# Patient Record
Sex: Female | Born: 1985 | Race: Black or African American | Hispanic: No | Marital: Married | Smoking: Never smoker
Health system: Southern US, Community
[De-identification: ages and names within clinical notes are randomized; demographics above are authoritative.]

## PROBLEM LIST (undated history)

## (undated) DIAGNOSIS — F419 Anxiety disorder, unspecified: Secondary | ICD-10-CM

## (undated) DIAGNOSIS — C801 Malignant (primary) neoplasm, unspecified: Secondary | ICD-10-CM

## (undated) DIAGNOSIS — D649 Anemia, unspecified: Secondary | ICD-10-CM

## (undated) DIAGNOSIS — K635 Polyp of colon: Secondary | ICD-10-CM

## (undated) DIAGNOSIS — J302 Other seasonal allergic rhinitis: Secondary | ICD-10-CM

## (undated) DIAGNOSIS — T7840XA Allergy, unspecified, initial encounter: Secondary | ICD-10-CM

## (undated) DIAGNOSIS — R112 Nausea with vomiting, unspecified: Secondary | ICD-10-CM

## (undated) DIAGNOSIS — Z9889 Other specified postprocedural states: Secondary | ICD-10-CM

## (undated) HISTORY — PX: COSMETIC SURGERY: SHX468

## (undated) HISTORY — DX: Allergy, unspecified, initial encounter: T78.40XA

## (undated) HISTORY — PX: COLONOSCOPY: SHX174

## (undated) HISTORY — PX: MASTECTOMY: SHX3

## (undated) HISTORY — DX: Polyp of colon: K63.5

## (undated) HISTORY — PX: CYSTECTOMY: SUR359

## (undated) HISTORY — DX: Anxiety disorder, unspecified: F41.9

## (undated) HISTORY — PX: WISDOM TOOTH EXTRACTION: SHX21

## (undated) HISTORY — PX: OVARY SURGERY: SHX727

## (undated) HISTORY — PX: MYOMECTOMY: SHX85

## (undated) HISTORY — PX: REDUCTION MAMMAPLASTY: SUR839

---
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
## 2004-06-18 ENCOUNTER — Emergency Department (HOSPITAL_COMMUNITY): Admission: EM | Admit: 2004-06-18 | Discharge: 2004-06-18 | Payer: Self-pay | Admitting: Emergency Medicine

## 2005-12-09 ENCOUNTER — Inpatient Hospital Stay (HOSPITAL_COMMUNITY): Admission: AD | Admit: 2005-12-09 | Discharge: 2005-12-09 | Payer: Self-pay | Admitting: Gynecology

## 2006-09-11 ENCOUNTER — Inpatient Hospital Stay (HOSPITAL_COMMUNITY): Admission: AD | Admit: 2006-09-11 | Discharge: 2006-09-12 | Payer: Self-pay | Admitting: Obstetrics and Gynecology

## 2006-10-13 ENCOUNTER — Ambulatory Visit (HOSPITAL_COMMUNITY): Admission: RE | Admit: 2006-10-13 | Discharge: 2006-10-13 | Payer: Self-pay | Admitting: Obstetrics and Gynecology

## 2007-02-23 ENCOUNTER — Emergency Department (HOSPITAL_COMMUNITY): Admission: EM | Admit: 2007-02-23 | Discharge: 2007-02-23 | Payer: Self-pay | Admitting: Emergency Medicine

## 2008-02-19 ENCOUNTER — Inpatient Hospital Stay (HOSPITAL_COMMUNITY): Admission: AD | Admit: 2008-02-19 | Discharge: 2008-02-19 | Payer: Self-pay | Admitting: Obstetrics & Gynecology

## 2008-02-23 ENCOUNTER — Ambulatory Visit (HOSPITAL_COMMUNITY): Admission: RE | Admit: 2008-02-23 | Discharge: 2008-02-23 | Payer: Self-pay | Admitting: Family Medicine

## 2008-04-03 ENCOUNTER — Encounter: Payer: Self-pay | Admitting: Obstetrics & Gynecology

## 2008-04-03 ENCOUNTER — Inpatient Hospital Stay (HOSPITAL_COMMUNITY): Admission: RE | Admit: 2008-04-03 | Discharge: 2008-04-05 | Payer: Self-pay | Admitting: Obstetrics & Gynecology

## 2008-08-29 ENCOUNTER — Inpatient Hospital Stay (HOSPITAL_COMMUNITY): Admission: AD | Admit: 2008-08-29 | Discharge: 2008-08-29 | Payer: Self-pay | Admitting: Obstetrics

## 2008-09-03 ENCOUNTER — Emergency Department (HOSPITAL_BASED_OUTPATIENT_CLINIC_OR_DEPARTMENT_OTHER): Admission: EM | Admit: 2008-09-03 | Discharge: 2008-09-03 | Payer: Self-pay | Admitting: Emergency Medicine

## 2008-09-03 ENCOUNTER — Ambulatory Visit: Payer: Self-pay | Admitting: Radiology

## 2008-09-11 ENCOUNTER — Ambulatory Visit: Payer: Self-pay | Admitting: Family Medicine

## 2009-01-09 ENCOUNTER — Ambulatory Visit: Payer: Self-pay | Admitting: Radiology

## 2009-01-09 ENCOUNTER — Emergency Department (HOSPITAL_BASED_OUTPATIENT_CLINIC_OR_DEPARTMENT_OTHER): Admission: EM | Admit: 2009-01-09 | Discharge: 2009-01-09 | Payer: Self-pay | Admitting: Emergency Medicine

## 2009-07-28 IMAGING — US US TRANSVAGINAL NON-OB
1 series · 13 of 25 positions shown · non-contrast
Comparison: 02/23/2007

CLINICAL DATA: Known fibroids.  Evaluate for ovarian cyst.  LMP
02/05/2008.  Status post right salpingo-oophorectomy.

TRANSABDOMINAL AND TRANSVAGINAL ULTRASOUND OF PELVIS
TECHNIQUE: Both transabdominal and transvaginal ultrasound
examinations of the pelvis were performed including evaluation of
the uterus, ovaries, adnexal regions, and pelvic cul-de-sac.

[Series 1: us transvaginal non-ob · 0.28mm/px · 13 of 39 slices shown]
[im 1/39]
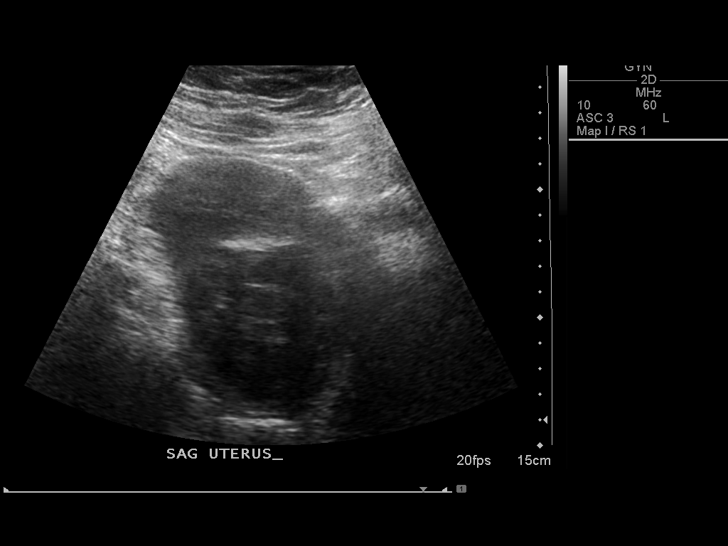
[im 4/39]
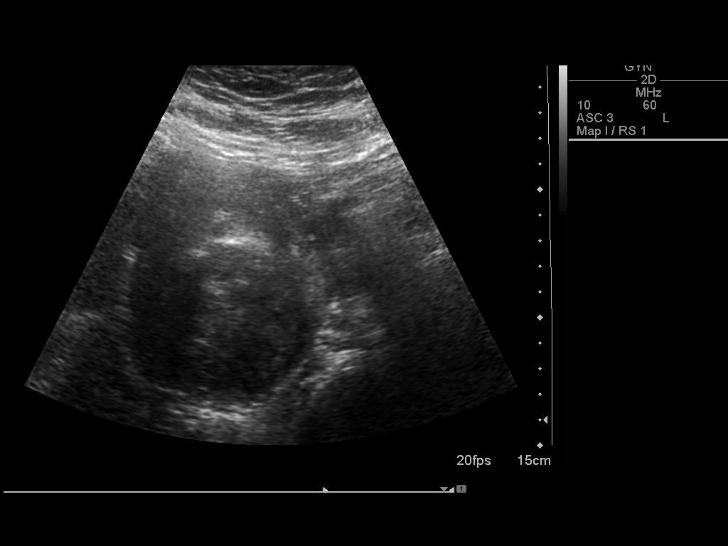
[im 7/39]
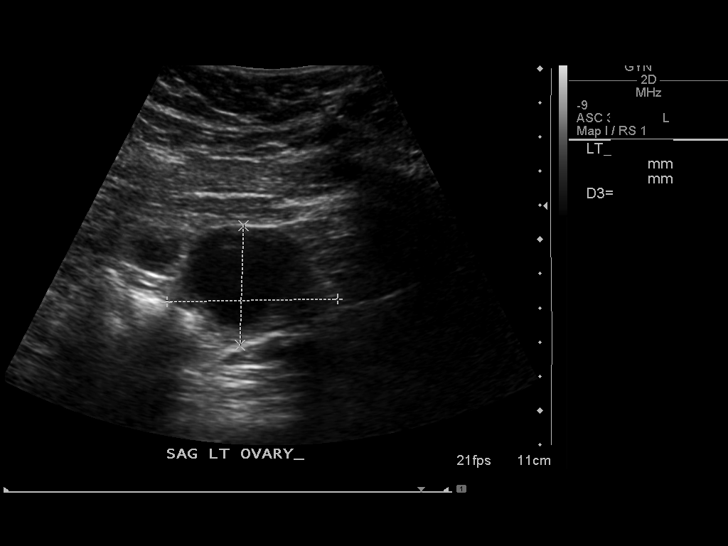
[im 10/39]
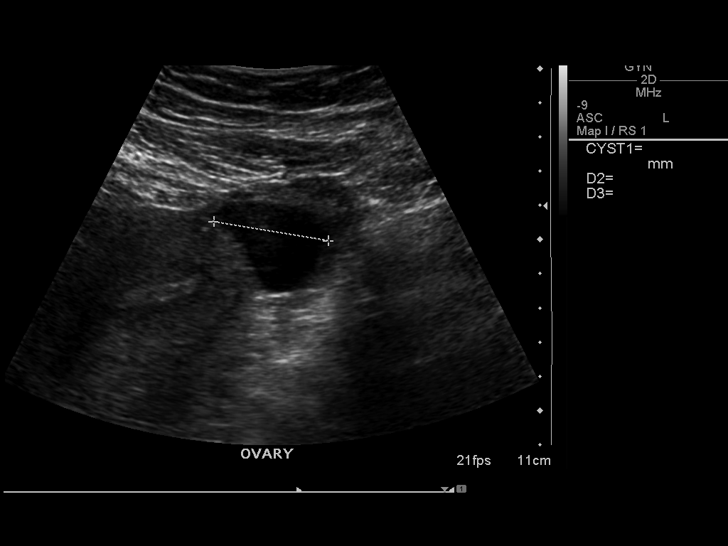
[im 13/39]
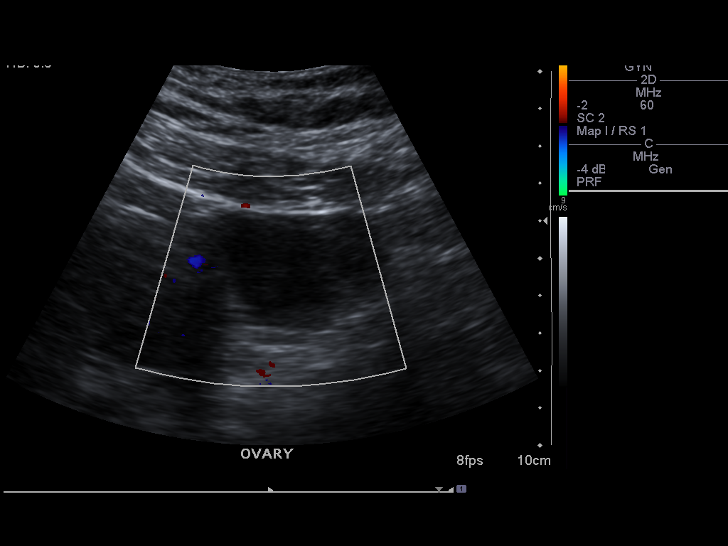
[im 16/39]
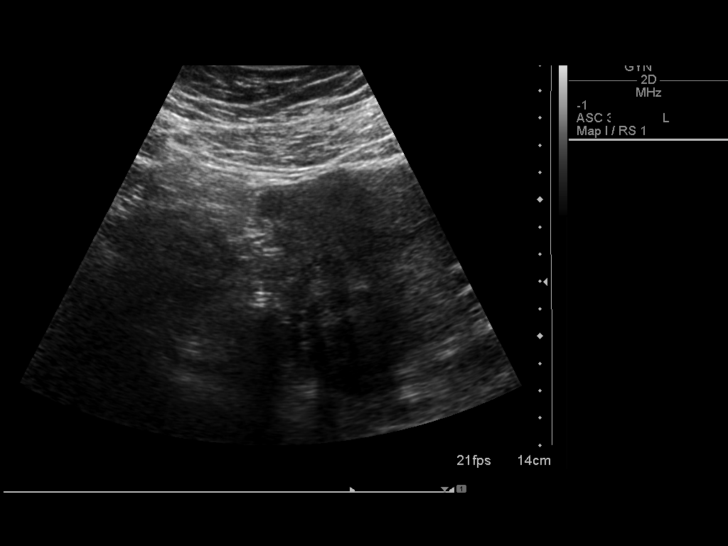
[im 20/39]
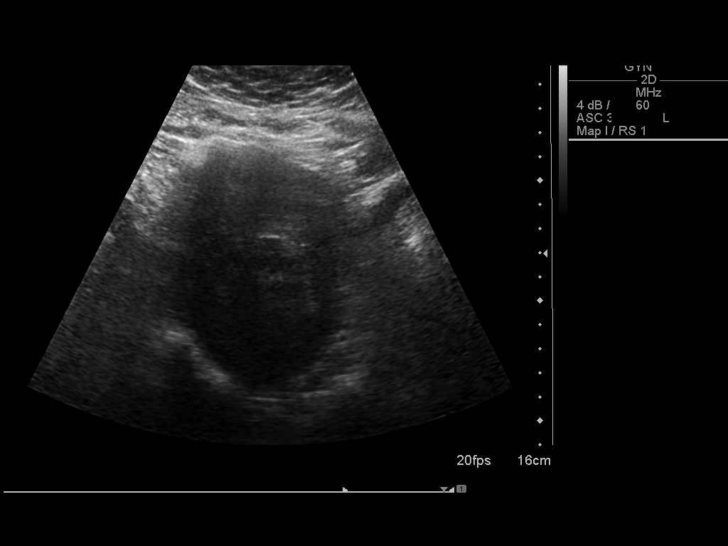
[im 23/39]
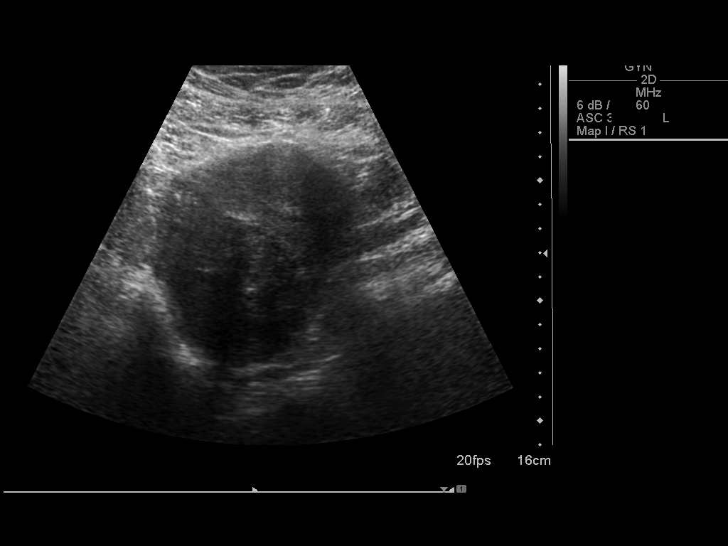
[im 26/39]
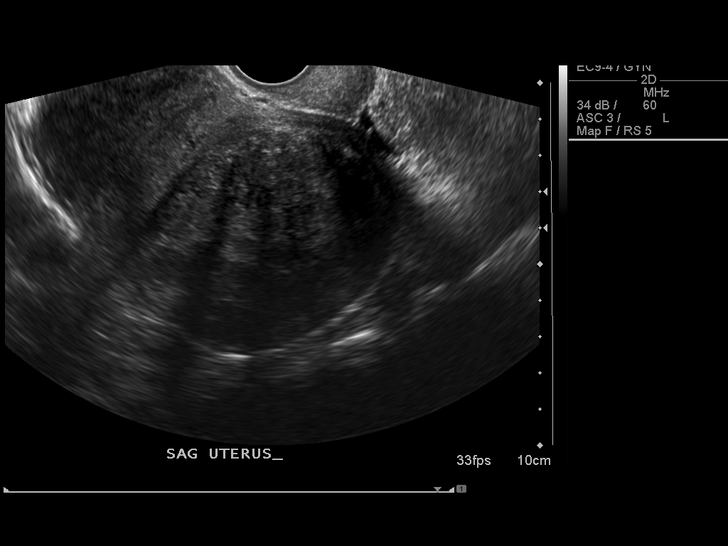
[im 29/39]
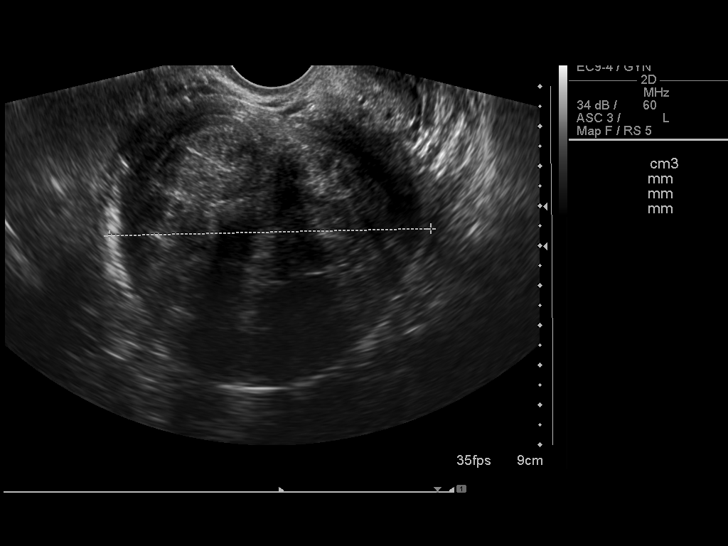
[im 32/39]
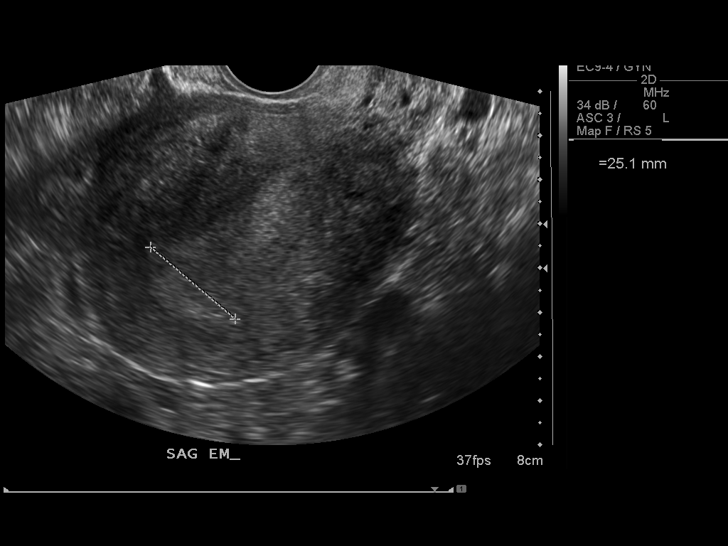
[im 35/39]
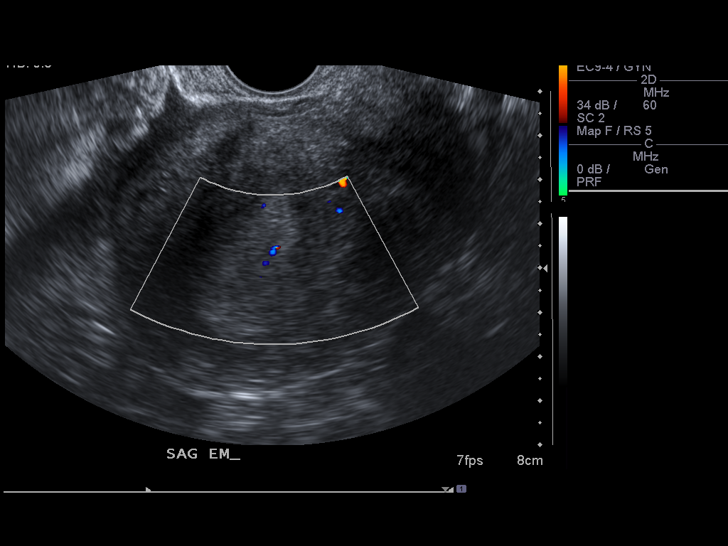
[im 39/39]
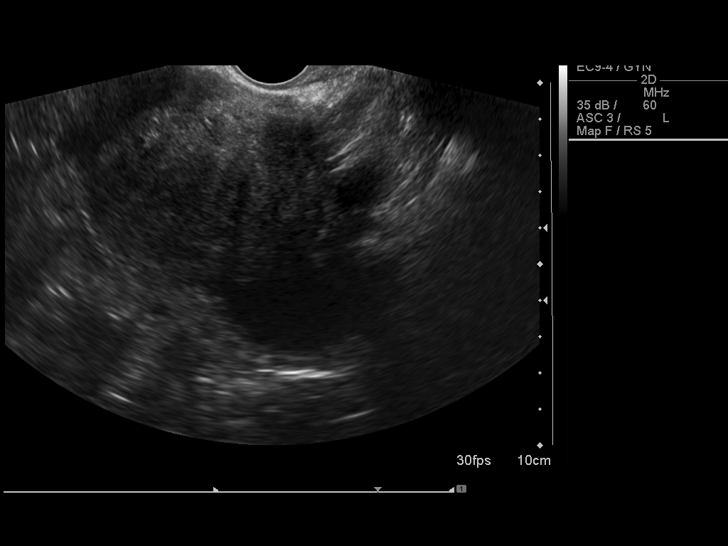

[13 of 25 positions shown; findings below may reference images not displayed]

FINDINGS: The uterus is enlarged with a sagittal length of 12.8 cm,
an AP width of 9.4 cm and a transverse width of 7.7 cm.  Two focal
fibroids are identified.  The largest is located in the posterior
lower uterine segment with a significant subserosal component
measuring 5.9 x 7.9 x 8.1 cm.  A second smaller fibroid is
identified in the anterior upper uterine segment with a small
subserosal component measuring 2.4 x 2.5 x 2.6 cm.

Endovaginally the endometrial canal appears thickened with an AP
width of 2.5 cm.  This is homogeneous in nature.  This would be
considered prominent even for the patient's given presecretory
phase of the cycle.  No abnormal flow is seen within the canal and
further evaluation in the immediate post secretary phase of the
cycle would be useful to exclude a focal endometrial abnormality.
If the lining appears prominent in the post secretary phase of the
cycle, sonohysterography would be useful for complete evaluation.

The left ovary is best seen transabdominally measuring 5.0 x 3.5 x
4.8 cm and containing a unilocular cyst which measures 3.4 x 2.9 x
2.6 cm.  Transabdominally this appears simple in nature, but this
could not be confirmed endovaginally as the cyst was inadequately
assessed by this method.  This is likely physiologic but can be
reassessed at the time of endometrial reassessment.
IMPRESSION: Fibroid uterus with fibroid sizes and locations as noted above.
Thick endometrial lining.  Please see above report for discussion
and followup recommendations.  Unilocular left ovarian cyst felt
likely to be simple by transabdominal imaging.  Short-term follow-
up at the time of endometrial reassessment would be recommended to
assess for resolution or involution as would be anticipated with a
physiologic finding.

## 2009-11-21 ENCOUNTER — Ambulatory Visit: Payer: Self-pay | Admitting: Family Medicine

## 2009-11-21 ENCOUNTER — Encounter: Admission: RE | Admit: 2009-11-21 | Discharge: 2009-11-21 | Payer: Self-pay | Admitting: Family Medicine

## 2010-02-20 ENCOUNTER — Emergency Department (HOSPITAL_BASED_OUTPATIENT_CLINIC_OR_DEPARTMENT_OTHER): Admission: EM | Admit: 2010-02-20 | Discharge: 2010-02-20 | Payer: Self-pay | Admitting: Emergency Medicine

## 2010-03-17 ENCOUNTER — Emergency Department (HOSPITAL_BASED_OUTPATIENT_CLINIC_OR_DEPARTMENT_OTHER): Admission: EM | Admit: 2010-03-17 | Discharge: 2010-03-17 | Payer: Self-pay | Admitting: Emergency Medicine

## 2010-03-20 ENCOUNTER — Emergency Department (HOSPITAL_BASED_OUTPATIENT_CLINIC_OR_DEPARTMENT_OTHER): Admission: EM | Admit: 2010-03-20 | Discharge: 2010-03-21 | Payer: Self-pay | Admitting: Emergency Medicine

## 2010-03-24 ENCOUNTER — Ambulatory Visit: Payer: Self-pay | Admitting: Family Medicine

## 2010-04-07 ENCOUNTER — Ambulatory Visit: Payer: Self-pay | Admitting: Diagnostic Radiology

## 2010-04-07 ENCOUNTER — Emergency Department (HOSPITAL_BASED_OUTPATIENT_CLINIC_OR_DEPARTMENT_OTHER): Admission: EM | Admit: 2010-04-07 | Discharge: 2010-04-07 | Payer: Self-pay | Admitting: Emergency Medicine

## 2010-04-08 ENCOUNTER — Ambulatory Visit: Payer: Self-pay | Admitting: Physician Assistant

## 2010-04-09 ENCOUNTER — Encounter: Admission: RE | Admit: 2010-04-09 | Discharge: 2010-04-09 | Payer: Self-pay | Admitting: Family Medicine

## 2010-04-30 ENCOUNTER — Emergency Department (HOSPITAL_BASED_OUTPATIENT_CLINIC_OR_DEPARTMENT_OTHER): Admission: EM | Admit: 2010-04-30 | Discharge: 2010-04-30 | Payer: Self-pay | Admitting: Emergency Medicine

## 2010-05-03 ENCOUNTER — Emergency Department (HOSPITAL_BASED_OUTPATIENT_CLINIC_OR_DEPARTMENT_OTHER): Admission: EM | Admit: 2010-05-03 | Discharge: 2010-05-03 | Payer: Self-pay | Admitting: Emergency Medicine

## 2010-05-08 ENCOUNTER — Emergency Department (HOSPITAL_BASED_OUTPATIENT_CLINIC_OR_DEPARTMENT_OTHER): Admission: EM | Admit: 2010-05-08 | Discharge: 2010-05-08 | Payer: Self-pay | Admitting: Emergency Medicine

## 2010-05-20 ENCOUNTER — Emergency Department (HOSPITAL_BASED_OUTPATIENT_CLINIC_OR_DEPARTMENT_OTHER): Admission: EM | Admit: 2010-05-20 | Discharge: 2010-05-20 | Payer: Self-pay | Admitting: Emergency Medicine

## 2010-06-26 ENCOUNTER — Encounter: Admission: RE | Admit: 2010-06-26 | Discharge: 2010-06-26 | Payer: Self-pay | Admitting: Obstetrics

## 2010-09-04 ENCOUNTER — Emergency Department (HOSPITAL_BASED_OUTPATIENT_CLINIC_OR_DEPARTMENT_OTHER)
Admission: EM | Admit: 2010-09-04 | Discharge: 2010-09-04 | Payer: Self-pay | Source: Home / Self Care | Admitting: Emergency Medicine

## 2010-09-04 LAB — DIFFERENTIAL
Basophils Absolute: 0 10*3/uL (ref 0.0–0.1)
Basophils Relative: 0 % (ref 0–1)
Eosinophils Absolute: 0.2 10*3/uL (ref 0.0–0.7)
Eosinophils Relative: 4 % (ref 0–5)
Lymphocytes Relative: 43 % (ref 12–46)
Lymphs Abs: 2.6 10*3/uL (ref 0.7–4.0)
Monocytes Absolute: 0.7 10*3/uL (ref 0.1–1.0)
Monocytes Relative: 12 % (ref 3–12)
Neutro Abs: 2.4 10*3/uL (ref 1.7–7.7)
Neutrophils Relative %: 40 % — ABNORMAL LOW (ref 43–77)

## 2010-09-04 LAB — COMPREHENSIVE METABOLIC PANEL
ALT: 16 U/L (ref 0–35)
AST: 22 U/L (ref 0–37)
Albumin: 4.3 g/dL (ref 3.5–5.2)
Alkaline Phosphatase: 78 U/L (ref 39–117)
BUN: 5 mg/dL — ABNORMAL LOW (ref 6–23)
CO2: 24 mEq/L (ref 19–32)
Calcium: 9.2 mg/dL (ref 8.4–10.5)
Chloride: 106 mEq/L (ref 96–112)
Creatinine, Ser: 0.6 mg/dL (ref 0.4–1.2)
GFR calc Af Amer: >60 mL/min (ref >60)
GFR calc non Af Amer: >60 mL/min (ref >60)
Glucose, Bld: 86 mg/dL (ref 70–99)
Potassium: 4 mEq/L (ref 3.5–5.1)
Sodium: 142 mEq/L (ref 135–145)
Total Bilirubin: 0.6 mg/dL (ref 0.3–1.2)
Total Protein: 8.4 g/dL — ABNORMAL HIGH (ref 6.0–8.3)

## 2010-09-04 LAB — LIPASE, BLOOD: Lipase: 122 U/L (ref 23–300)

## 2010-09-04 LAB — CBC
HCT: 32.2 % — ABNORMAL LOW (ref 36.0–46.0)
Hemoglobin: 10.5 g/dL — ABNORMAL LOW (ref 12.0–15.0)
MCH: 19.8 pg — ABNORMAL LOW (ref 26.0–34.0)
MCHC: 32.6 g/dL (ref 30.0–36.0)
MCV: 60.6 fL — ABNORMAL LOW (ref 78.0–100.0)
Platelets: 365 10*3/uL (ref 150–400)
RBC: 5.31 MIL/uL — ABNORMAL HIGH (ref 3.87–5.11)
RDW: 19 % — ABNORMAL HIGH (ref 11.5–15.5)
WBC: 6 10*3/uL (ref 4.0–10.5)

## 2010-09-04 LAB — URINALYSIS, ROUTINE W REFLEX MICROSCOPIC
Bilirubin Urine: NEGATIVE
Ketones, ur: NEGATIVE mg/dL
Nitrite: NEGATIVE
Protein, ur: 30 mg/dL — AB
Specific Gravity, Urine: 1.021 (ref 1.005–1.030)
Urine Glucose, Fasting: NEGATIVE mg/dL
Urobilinogen, UA: 0.2 mg/dL (ref 0.0–1.0)
pH: 5.5 (ref 5.0–8.0)

## 2010-09-04 LAB — URINE MICROSCOPIC-ADD ON

## 2010-09-04 LAB — PREGNANCY, URINE: Preg Test, Ur: NEGATIVE

## 2010-09-09 ENCOUNTER — Ambulatory Visit: Admit: 2010-09-09 | Payer: Self-pay | Admitting: Family Medicine

## 2010-09-20 ENCOUNTER — Encounter: Payer: Self-pay | Admitting: Obstetrics

## 2010-11-13 LAB — BASIC METABOLIC PANEL
BUN: 7 mg/dL (ref 6–23)
Chloride: 108 mEq/L (ref 96–112)
GFR calc non Af Amer: >60 mL/min (ref >60)
Glucose, Bld: 116 mg/dL — ABNORMAL HIGH (ref 70–99)
Potassium: 4 mEq/L (ref 3.5–5.1)
Sodium: 143 mEq/L (ref 135–145)

## 2010-11-13 LAB — URINALYSIS, ROUTINE W REFLEX MICROSCOPIC
Bilirubin Urine: NEGATIVE
Glucose, UA: NEGATIVE mg/dL
Ketones, ur: NEGATIVE mg/dL
Nitrite: NEGATIVE
Specific Gravity, Urine: 1.016 (ref 1.005–1.030)
pH: 6 (ref 5.0–8.0)

## 2010-11-13 LAB — WET PREP, GENITAL
Trich, Wet Prep: NONE SEEN
Yeast Wet Prep HPF POC: NONE SEEN

## 2010-11-13 LAB — DIFFERENTIAL
Basophils Relative: 0 % (ref 0–1)
Lymphocytes Relative: 28 % (ref 12–46)
Lymphs Abs: 2.1 10*3/uL (ref 0.7–4.0)
Monocytes Relative: 8 % (ref 3–12)
Neutro Abs: 4.7 10*3/uL (ref 1.7–7.7)

## 2010-11-13 LAB — CBC
HCT: 30.6 % — ABNORMAL LOW (ref 36.0–46.0)
Hemoglobin: 10.3 g/dL — ABNORMAL LOW (ref 12.0–15.0)
MCV: 69.4 fL — ABNORMAL LOW (ref 78.0–100.0)
WBC: 7.6 10*3/uL (ref 4.0–10.5)

## 2010-11-13 LAB — PREGNANCY, URINE: Preg Test, Ur: NEGATIVE

## 2010-11-14 LAB — DIFFERENTIAL
Basophils Relative: 0 % (ref 0–1)
Eosinophils Relative: 5 % (ref 0–5)
Monocytes Absolute: 0.8 10*3/uL (ref 0.1–1.0)
Monocytes Relative: 14 % — ABNORMAL HIGH (ref 3–12)
Neutro Abs: 2.5 10*3/uL (ref 1.7–7.7)

## 2010-11-14 LAB — URINALYSIS, ROUTINE W REFLEX MICROSCOPIC
Glucose, UA: NEGATIVE mg/dL
Protein, ur: NEGATIVE mg/dL

## 2010-11-14 LAB — COMPREHENSIVE METABOLIC PANEL
ALT: 11 U/L (ref 0–35)
AST: 16 U/L (ref 0–37)
Albumin: 3.8 g/dL (ref 3.5–5.2)
Alkaline Phosphatase: 57 U/L (ref 39–117)
BUN: 9 mg/dL (ref 6–23)
GFR calc Af Amer: >60 mL/min (ref >60)
Potassium: 4.2 mEq/L (ref 3.5–5.1)
Sodium: 143 mEq/L (ref 135–145)
Total Protein: 7.7 g/dL (ref 6.0–8.3)

## 2010-11-14 LAB — URINE MICROSCOPIC-ADD ON

## 2010-11-14 LAB — CBC
Platelets: 278 10*3/uL (ref 150–400)
RBC: 4.12 MIL/uL (ref 3.87–5.11)
RDW: 14.8 % (ref 11.5–15.5)
WBC: 5.4 10*3/uL (ref 4.0–10.5)

## 2010-11-16 LAB — CBC
MCH: 25.8 pg — ABNORMAL LOW (ref 26.0–34.0)
MCHC: 33.4 g/dL (ref 30.0–36.0)
Platelets: 292 10*3/uL (ref 150–400)

## 2010-11-16 LAB — DIFFERENTIAL
Basophils Relative: 0 % (ref 0–1)
Eosinophils Absolute: 0.3 10*3/uL (ref 0.0–0.7)
Neutrophils Relative %: 48 % (ref 43–77)

## 2010-12-15 LAB — DIFFERENTIAL
Eosinophils Absolute: 0.2 10*3/uL (ref 0.0–0.7)
Lymphocytes Relative: 31 % (ref 12–46)
Lymphs Abs: 2 10*3/uL (ref 0.7–4.0)
Neutrophils Relative %: 54 % (ref 43–77)

## 2010-12-15 LAB — PREGNANCY, URINE: Preg Test, Ur: NEGATIVE

## 2010-12-15 LAB — CBC
Platelets: 235 10*3/uL (ref 150–400)
WBC: 6.4 10*3/uL (ref 4.0–10.5)

## 2011-01-13 NOTE — Op Note (Signed)
Alejandra Figueroa, PEDDIE              ACCOUNT NO.:  [id number]   MEDICAL RECORD NO.:  [id number]          PATIENT TYPE:  INP   LOCATION:  9316                          FACILITY:  [facility]   PHYSICIAN:  Roseanna Rainbow, M.D.DATE OF BIRTH:  12/10/1985   DATE OF PROCEDURE:  04/03/2008  DATE OF DISCHARGE:                               OPERATIVE REPORT   PREOPERATIVE DIAGNOSIS:  Symptomatic uterine fibroids.   POSTOPERATIVE DIAGNOSIS:  Symptomatic uterine fibroids.   PROCEDURE:  Multiple abdominal myomectomy.   SURGEON:  Roseanna Rainbow, MD.   ASSISTANT:  Charles A. Clearance Coots, MD   ANESTHESIA:  General endotracheal.   FINDINGS:  There were filmy adhesions involving the anterior cul-de-sac.  The right tube and ovary were surgically absent.  There was a fairly  large posterior fundal myoma approximately 6 cm in diameter and there  was a smaller anterior right-sided subserosal myoma approximately 4 cm  in diameter.   PATHOLOGY:  Myomas.   ESTIMATED BLOOD LOSS:  125 mL.   COMPLICATIONS:  None.   PROCEDURE:  The patient was taken to the operating room with an IV  running.  She was placed in the dorsal lithotomy position and prepped  and draped in the usual sterile fashion.  After a time-out had been  completed, the previous Pfannenstiel incision scar was incised with the  scalpel.  This was carried down to the underlying fascia.  The fascia  was nicked through the midline.  The fascial incision was then extended  bilaterally.  The superior aspect of the fascial incision was tented up  and the underlying rectus muscles were dissected off.  The inferior  aspect of the fascial incision was manipulated in a similar fashion.  The rectus muscles were separated in the midline.  The parietal  peritoneum was then tented up and entered sharply.  This incision was  then extended superiorly and inferiorly with good visualization of the  bladder.  At this point, an Alexis retractor was placed  into the  incision and the bowel packed away with moist laparotomy sponges.  The  serosa overlying the posterior fibroid was then infiltrated with a  dilute Pitressin solution.  The serosa was then incised using the Bovie  down to the myoma.  Using the Bovie and traction and counter traction,  the myoma was enucleated.  The base was then secured with a Kelly clamp  and a suture ligature of 0 Monocryl.  The defect in the myometrium was  then repaired in layers using figure-of-eight sutures of 0 Monocryl.  The serosa was reapproximated in a running fashion using 2-0 Monocryl.  Adequate hemostasis was noted.  The anterior myoma was manipulated in a  similar fashion.  The pelvis was copiously irrigated.  The anterior cul-  de-sac filmy adhesions were divided using blunt and sharp dissection.  Seprafilm was then placed over the incisions on the uterus.  All the  packs and instruments were then removed from the abdomen.  At the close  of the procedure, the parietal peritoneum was then reapproximated in a  running fashion using 2-0  Vicryl.  The fascia was closed in a running fashion using 0 PDS.  The  skin was closed in a subcuticular fashion using 3-0 Monocryl.  At the  close of the procedure, the instrument and pack counts were said to be  correct x2.  The patient was taken to the PACU awake and in stable  condition.      Roseanna Rainbow, M.D.  Electronically Signed     LAJ/MEDQ  D:  04/03/2008  T:  04/04/2008  Job:  [id number]

## 2011-01-13 NOTE — H&P (Signed)
Alejandra Figueroa, QUIRION              ACCOUNT NO.:  [id number]   MEDICAL RECORD NO.:  [id number]         PATIENT TYPE:  WINP   LOCATION:                                FACILITY:  [facility]   PHYSICIAN:  Roseanna Rainbow, M.D.DATE OF BIRTH:  May 26, 1986   DATE OF ADMISSION:  04/03/2008  DATE OF DISCHARGE:                              HISTORY & PHYSICAL   CHIEF COMPLAINT:  The patient is a 25 year old with uterine fibroids  with secondary pain.   HISTORY OF PRESENT ILLNNESS:  The patient had presented to the [facility] emergency department in June 2009.  An ultrasound at that point  demonstrated uterus with a sagittal length of 12.8 cm.  There were 2  focal fibroids identified.  The largest is located in the posterior  lower uterine segment and measures about 8 cm in diameter.  The second  smaller fibroid is in the anterior upper uterine segment.  The left  ovary also contained an unilocular cyst that measured about 3 cm in  diameter.  Subsequent to this, the patient was seen in the office and  the uterus was tender.  The possibility was entertained that there was  degeneration of myoma with a superimposed infectious process.  She was  given a course of antibiotics for possible superimposed bacterial  infection.  Her pain persisted despite these measures.   REVIEW OF SYSTEMS:  GU:  Please see the above.   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   PAST GYN HISTORY:  Please see the above.  There is a history of  oligomenorrhea.  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  There is a history  of abnormal Pap smears.   PAST SURGICAL HISTORY:  Please see the above.  She has had a breast  reduction, breast lumpectomy, and laparoscopy.   FAMILY HISTORY:  Unknown.   SOCIAL HISTORY:  She is a nonsmoker and nondrinker.  No substance abuse.   ALLERGIES:  No known drug  allergies.   MEDICATIONS:  Please see the medication reconciliation form.   PHYSICAL EXAMINATION:  VITAL SIGNS:  Stable and afebrile.  LUNGS: Clear to auscultation bilaterally.  HEART:  Regular rate and rhythm.  ABDOMEN:  Soft and nontender.  No organomegaly.  PELVIC:  Normal  EGBUS.  On speculum exam, there is no discharge.  The  cervix is clear, firm, and closed.  No lesions.  Bimanual exam, the  uterus was moderately enlarged and mildly tender.  There is a lower  uterine segment posterior myomatous palpable.   ASSESSMENT:  Symptomatic uterine fibroids with pain, refractory to  conservative measures   PLAN:  The planned procedure is multiple abdominal myomectomies.  The  risks, benefits, and alternative forms of management have been reviewed  with the patient and informed consent had been obtained.      Roseanna Rainbow, M.D.  Electronically Signed     LAJ/MEDQ  D:  04/02/2008  T:  04/03/2008  Job:  [id number]

## 2011-01-16 NOTE — H&P (Signed)
[facility]  Patient:    Alejandra Figueroa, Alejandra Figueroa Visit Number: [id number] MRN: [id number]          Service Type: EMS Location: [facility] Attending Physician:  Devoria Albe Dictated by:   Guy Sandifer. Arleta Creek, M.D. Admit Date:  12/17/2001 Discharge Date: 12/17/2001                           History and Physical  CHIEF COMPLAINT:              Pelvic cyst.  HISTORY OF PRESENT ILLNESS:   This patient is a 25 year old single black female, G 0, P 0, who was noted to have a left ovarian cyst in October 2002. She subsequently had the onset of severe pelvic pain and was evaluated at the emergency department.  She had apparently a barium enema that was normal, with the exception of a pelvic abdominal cyst noted.  Her pain has persisted, and she presented to my office on December 19, 2001.  At that time examination was compromised by voluntary guarding; however, a pelvic ultrasound revealed a normal left ovary.  A cystic mass, presumably from the right ovary, measuring 12.9 cm x 8.1 cm x 14.6 cm was noted, that was crossing the midline to a level just below the umbilicus.  There were echogenic features to the cyst, as well as septations.  The patient has continued to have pain, although it is controlled somewhat with Percocet.  A CEA quantitative hCG and AFP tumor markers are all normal.  After carefully discussing the options with the patient and with her mother, she is being admitted for a laparotomy, possible ovarian cystectomy, possible salpingo-oophorectomy.  All questions have been answered.  PAST MEDICAL HISTORY:         Negative.  PAST SURGICAL HISTORY:        Negative.  CURRENT MEDICATIONS:          Percocet p.r.n.  ALLERGIES:                    No known drug allergies.  SOCIAL HISTORY:               The patient denies tobacco, alcohol, or drug abuse.  REVIEW OF SYSTEMS:            Negative except as above.  PHYSICAL EXAMINATION  GENERAL:                       Height 5 feet 5 inches.  HEENT/NECK:                   Without thyromegaly.  LUNGS:                        Clear to auscultation.  HEART:                        A regular rate and rhythm.  BACK:                         Without CVA tenderness.  BREASTS:                      Pendulous with fibrocystic change.  No dominant mass or retraction or discharge.  ABDOMEN:  Obese, mildly tender, with a midline mass arising from the pelvis to the level of the umbilicus.  BIMANUAL EXAMINATION:         Is compromised by the patients voluntary guarding.  EXTREMITIES:                  Grossly within normal limits.  NEUROLOGIC:                   Grossly within normal limits.  ASSESSMENT:                   Adnexal cyst.  PLAN:                         Laparotomy, possible ovarian cystectomy, possible salpingo-oophorectomy. Dictated by:   Guy Sandifer Arleta Creek, M.D. Attending Physician:  Devoria Albe DD:  12/26/01 TD:  12/26/01 Job: [id number]

## 2011-01-16 NOTE — Discharge Summary (Signed)
[facility]  Patient:    Alejandra Figueroa, Alejandra Figueroa Visit Number: [id number] MRN: [id number]          Service Type: GYN Location: 9300 [id number] Attending Physician:  Soledad Gerlach Dictated by:   Guy Sandifer Arleta Creek, M.D. Admit Date:  12/29/2001 Discharge Date: 01/01/2002                             Discharge Summary  ADMISSION DIAGNOSIS:          Pelvic cyst.  DISCHARGE DIAGNOSIS:          Hemorrhagic right ovarian cyst.  PROCEDURE:                    On Dec 29, 2001, exploratory laparotomy with right salpingo-oophorectomy and partial omentectomy, abdominopelvic washings.  REASON FOR ADMISSION:         This patient is a 25 year old single black female, gravida 0, para 0, with a pelvic abdominal cyst.  Details are dictated in the history and physical.  HOSPITAL COURSE:              The patient is admitted to the hospital and undergoes the above procedure.  Estimated blood loss is 500 cc, although, 300 cc of this is drained from the cyst.  Frozen section at the time of surgery revealed necrotic hemorrhagic tissue with multiple papillations suspicious for a borderline tumor.  On the evening of surgery, the patient has good pain relief and is ambulating well.  There is no nausea and vomiting.  Urine output is clear.  She had a temperature of 101.1 earlier in the day and was 100.1 that evening.  Lungs were clear.  Abdomen was soft and PAS hose were noted. On the first postoperative day, the patient was passing some flatus with no nausea and vomiting, although, she was not hungry.  She had good pain control, although, she was itching with the morphine and continued to ambulate well. She had been placed on Unasyn the previous day in view of her temperature elevation.  Temperature that morning was 98.9.  White count is 9.4 and hemoglobin is 9.8.  Incentive spirometry was continued as well as ambulation and the Unasyn.  On Dec 31, 2001, she continued to  ambulate well without nausea or vomiting.  Her maximum temperature was 100.7.  Temperature at the time of examination was 98.4.  A repeat CBC that day revealed a white count of 6.9, hemoglobin of 9.4 with 17 lymphs and 13 monos.  The patient complained of a cough that was sometimes productive of some dark green sputum.  However, she was otherwise without shortness of breath or other complaints.  On the day of discharge, she is feeling good, remains afebrile, and the incision looks good. She is discharged home in good condition.  DIET:                         Regular as tolerated.  DISCHARGE MEDICATIONS:        1. Ceftin 250 mg #6 one p.o. b.i.d. no refills.                               2. Vicodin #30 one to two p.o. q.6h. p.r.n.  3. Ibuprofen 600 mg p.o. q.6h. p.r.n.  She is to call the office for problems including, but not limited to, persistent nausea or vomiting, increasing pain, or temperature of 101 degrees.  FOLLOW-UP:                    In the office in two weeks. Dictated by:   Guy Sandifer Arleta Creek, M.D. Attending Physician:  Soledad Gerlach DD:  01/01/02 TD:  01/03/02 Job: [id number]

## 2011-01-16 NOTE — Op Note (Signed)
[facility]  Patient:    Alejandra Figueroa, Alejandra Figueroa Visit Number: [id number] MRN: [id number]          Service Type: GYN Location: 9300 [id number] Attending Physician:  Soledad Gerlach Dictated by:   Guy Sandifer Arleta Creek, M.D. Proc. Date: 12/29/01 Admit Date:  12/29/2001                             Operative Report  PREOPERATIVE DIAGNOSES:       Pelvic cyst.  POSTOPERATIVE DIAGNOSES:      Right ovarian cyst, hemorrhagic.  Possibly torsion.  PROCEDURE:                    1. Exploratory laparotomy.                               2. Right-sided salpingo-oophorectomy.                               3. Partial omentectomy.                               4. Abdominopelvic washings.  SURGEON:                      Guy Sandifer. Arleta Creek, M.D.  ASSISTANT:                    Raynald Kemp, M.D.  ANESTHESIA:                   General with endotracheal intubation.  ANESTHESIOLOGIST:             Belva Agee, M.D.  ESTIMATED BLOOD LOSS:         500 cc (approximately 300 cc was from the cyst).  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  INDICATIONS AND CONSENT:      This patient is a 25 year old single black female (G0, P0), who has a pelvic abdominal cyst -- presumably from the right ovary.  Details are dictated in the history and physical.  Laparotomy, possible right salpingo-oophorectomy with possible omentectomy has been discussed with the patient and her mother.  The potential risks and complications have been discussed; including but not limited to:  infection; bowel, bladder or ureteral damage; bleeding requiring transfusion of blood products, with possible transfusion reaction, HIV and hepatitis acquisitional; DVT; PE; pneumonia; recurrence of cyst and the potential for further surgery depending on results.  These have  all been discussed.  Questions are answered and consent is signed on the chart.  FINDINGS:                     There is an approximately 16 cm smooth-walled cystic structure arising from the right adnexa, sitting on top of the uterine fundus and rising to the level of 1-2 fingerbreadths below the umbilicus in the midline.  It has no excressences, but is widely adherent with thin, filmy adhesions to structures around; including the anterior abdominal wall as well as the bowel.  The uterus and anterior and posterior cul-de-sac appear normal. The left ovary, left fallopian tube are normal.  The right fallopian tube is removed as part of the right adnexal process.  DESCRIPTION OF PROCEDURE:     The patient is taken to the operating room and placed in the dorsal supine position.  There general anesthesia is induced via endotracheal intubation.  She is then prepped abdominally and vaginally.  A Foley catheter is placed and the bladder is then drained.  She is draped in a sterile fashion.  Prior to prepping the patient, abdominal examination was felt to be consistent with a cyst that would be mobile enough to allow approach with a Pfannenstiel incision.  Therefore, a Pfannenstiel incision was made and dissection was carried out in layers to the peritoneum.  The peritoneum was incised and extended superiorly and inferiorly.  Again, the cystic structure, which was gray in color but smooth-walled, was adherent with very filmy adhesions to the anterior abdominal wall.  This was taken down easily with the hand.  Washings were taken at this time.  Extensive dissection, again with the hand bluntly, easily took down all of the adhesions surrounding the structure.  While attempting to deliver the mass through the incision, it ruptured at one point; draining copious amounts of serous fluid with dark blood.  At this point a Sullivan-OConnor retractor could then be placed in the incision.  The fluid from  the cyst was suctioned away.  The anterior Harrison Mons was placed.  The bowels packed and the superior Harrison Mons was placed.  Careful examination revealed a possible torsion of a right ovarian cyst. There was clot within the utero-ovarian ligament.  However, the entire process was located on top of the uterine fundus, and none of the surgery or the above-described process extended below the level of the round ligament insertion into the right uterine fundus.  The ovary was elevated and was crossclamped; taken down and the specimen sent to pathology for frozen section.  The remainder of the specimen was clamped at the point of insertion of the utero-ovarian ligament into the right uterine fundus, at the level of the round ligament insertion in the uterus -- which was taken down.  This pedicle was doubly ligated; first with a free tie, then with a suture of 0 Monocryl.  Copious irrigation is carried out.  There is a single area of approximately 1 cm filmy process on a loop of small bowel, which could be consistent with the above-described adhesions.  However, it was resected in simple fashion and sent additionally for pathology.  The omentum was also loosely adherent, but was easily mobilized.  A partial omentectomy was then done with a series of clamps, which were then ligated with free ties of Monocryl suture.  At this point, it was noted that the patient had very little urine output -- although it was clear.  Again, none of the area of surgery extended down into the pelvic sidewalls at all, and in fact remained above the level of the right round ligament insertion into the uterus.  Palpation identified the Foley catheter within the bladder; although the dome of the bladder was indurated, no obvious defect was noted.  However, due to the relatively low urine output, a hole in the bladder was suspected.  Therefore, the Foley catheter was filled retrograde with sterile milk and the bladder was  noted to fill with no leakage of the milk.  The bladder was then drained through the Foley catheter.  Palpation of the upper abdomen did not reveal any studding or organomegaly. Retractors were then removed and the rectus muscles were plicated in the midline with interrupted 0 Monocryl suture.  The anterior rectus fascia is closed in a running fashion with 0 PDS sutures, starting at each angle and meeting in the middle.  The skin was closed with clips.  All sponge, instrument and needle counts were correct.  The patient was taken to the recovery room in stable condition.  Serum electrolytes and creatinine and CA125 will be drawn in the recovery room. Dictated by:   Guy Sandifer Arleta Creek, M.D. Attending Physician:  Soledad Gerlach DD:  12/29/01 TD:  12/31/01 Job: [id number]

## 2011-01-16 NOTE — Op Note (Signed)
Alejandra Figueroa, Alejandra Figueroa                        ACCOUNT NO.:  [id number]   MEDICAL RECORD NO.:  [id number]                   PATIENT TYPE:  AMB   LOCATION:  DAY                                  FACILITY:  [facility]   PHYSICIAN:  Timothy E. Earlene Plater, M.D.              DATE OF BIRTH:  November 01, 1985   DATE OF PROCEDURE:  09/29/2002  DATE OF DISCHARGE:                                 OPERATIVE REPORT   PREOPERATIVE DIAGNOSIS:  Left breast mass.   POSTOPERATIVE DIAGNOSIS:  Left breast mass.   PROCEDURE:  Excision of left breast mass.   SURGEON:  Timothy E. Earlene Plater, M.D.   ANESTHESIA:  Local standby.   INDICATIONS:  [name] is accompanied by her mother today.  She is 16.  No  other health factors except obesity and history of ovarian mass removed  surgically.  She has developed in the past three months an enlarging, tender  breast mass.  Ultrasound suggests a fibroadenoma, but because of its size  and tenderness and family history of breast cancer, she and her mother wish  to have this removed.  They agree and understand.  She was identified and  the permit signed, the site marked, and evaluated by anesthesia.   PROCEDURE:  She was taken to the operating room and placed supine.  IV  sedation given.  She was placed in the head-up position so that the large  left breast would be suspended inferiorly.  The mass was visible and  palpable in the 12 o'clock position near the top of the breast.  This area  was prepped and draped in the usual fashion.  0.25% Marcaine with  epinephrine was used throughout for local anesthesia.  An incision was made  over the mass, the scant subcutaneous tissue dissected, and the mass itself  identified, grasped, and sharply dissected from the surrounding breast  tissue in two portions.  The mass was large, and because of its questionable  type, I did bisect the specimen. It contained a 1.8 cm hematoma, surrounded  by abnormal fleshy breast tissue.  A second specimen  removed laterally was  more of the hemorrhagic fleshy tissue.  The remaining cavity appeared  completely normal.  Bleeding was carefully controlled with the cautery.  To  note that from the incision, the cavity of the excised mass extended 4 cm  superomedially and 3 cm inferolaterally.  This area was loosely closed with  Vicryl, and the skin was closed with Monocryl.  Steri-Strips were applied.  A dry, sterile dressing was applied.  The counts were correct.  She  tolerated it well.  The specimen was carefully marked for pathology and  submitted to pathology.   Written and verbal instructions were given to the patient and her mother,  and she will be seen and followed as an outpatient.  Timothy E. Earlene Plater, M.D.    TED/MEDQ  D:  09/29/2002  T:  09/29/2002  Job:  [id number]   cc:   Guy Sandifer. Arleta Creek, M.D.  [address]  [place]  [place] [address]  Fax: [phone]

## 2011-01-16 NOTE — Consult Note (Signed)
[facility]  Patient:    Alejandra Figueroa, Alejandra Figueroa Visit Number: [id number] MRN: [id number]          Service Type: GON Location: GYN Attending Physician:  Jeannette Corpus Dictated by:   Rande Brunt. Clarke-Pearson, M.D. Proc. Date: 01/04/02 Admit Date:  01/04/2002 Discharge Date: 01/04/2002   CC:         Fayrene Fearing E. Arleta Creek, M.D.  Telford Nab, R.N.   Consultation Report  A 25 year old African-American female seen in consultation at the request of Dr. Harold Hedge.  The patient has a large ovarian mass resected on May 1 measuring 16 x 12 x 6 cm.  The mass was blood filled, blood tinged and pathology has been hampered somewhat by the hemorrhagic infarction with no apparent viable epithelium.  The pathologist felt that the patient had numerous papillae associated with complete hemorrhagic infarction and that a borderline tumor could not be excluded.  Omentectomy, small bowel nodule, as well as peritoneal washings were negative.  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  I had a lengthy discussion with the patient, her mother, and aunt regarding the natural history of borderline tumors.  I indicated that I thought her prognosis is very good and that would simply recommend that she have surveillance with an ultrasound every six months.  I am not aware that the patient had a CA-125 value preoperatively so therefore I would suggest that if it was elevated preoperatively, follow that every six months as well.  I think the risk of recurrence is exceedingly low and I quoted the patient and her mother approximately 2-5% risk.  I would not recommend chemotherapy.  She will return to the care of Dr. Henderson Cloud for serial ultrasounds and CA-125 values. Dictated by:   Rande Brunt. Clarke-Pearson, M.D. Attending Physician:  Jeannette Corpus DD:  01/04/02 TD:  01/06/02 Job: [id number]

## 2011-01-16 NOTE — Discharge Summary (Signed)
Alejandra Figueroa, Alejandra Figueroa              ACCOUNT NO.:  [id number]   MEDICAL RECORD NO.:  [id number]          PATIENT TYPE:  INP   LOCATION:  9316                          FACILITY:  [facility]   PHYSICIAN:  Roseanna Rainbow, M.D.DATE OF BIRTH:  [birth year]   DATE OF ADMISSION:  04/03/2008  DATE OF DISCHARGE:  04/05/2008                               DISCHARGE SUMMARY   CHIEF COMPLAINT:  The patient is a 25 year old with uterine fibroids and  pelvic pain.  Please see the dictated history and physical for further  details.   HOSPITAL COURSE:  The patient was admitted and underwent multiple  abdominal myomectomies.  Please see the dictated operative summary.  On  postoperative day #1, her hemoglobin was 11.  The remainder of her  postoperative course was uneventful.  She was discharged to home on  postoperative day #2 tolerating a regular diet.   DISCHARGE DIAGNOSES:  Symptomatic uterine fibroids, pelvic pain.   PROCEDURE:  Multiple abdominal myomectomies.   CONDITION:  Good.   DIET:  Regular.   ACTIVITY:  Progressive activity, pelvic rest.   MEDICATIONS:  Percocet.   DISPOSITION:  The patient was to follow up in the office in 2 weeks.      Roseanna Rainbow, M.D.  Electronically Signed     LAJ/MEDQ  D:  04/27/2008  T:  04/28/2008  Job:  [id number]

## 2011-05-28 LAB — URINALYSIS, ROUTINE W REFLEX MICROSCOPIC
Glucose, UA: NEGATIVE
Protein, ur: NEGATIVE
pH: 5

## 2011-05-28 LAB — WET PREP, GENITAL
Clue Cells Wet Prep HPF POC: NONE SEEN
Trich, Wet Prep: NONE SEEN

## 2011-05-28 LAB — URINE MICROSCOPIC-ADD ON

## 2011-05-28 LAB — GC/CHLAMYDIA PROBE AMP, GENITAL: Chlamydia, DNA Probe: NEGATIVE

## 2011-05-29 LAB — CBC
HCT: 33.8 — ABNORMAL LOW
Hemoglobin: 11.2 — ABNORMAL LOW
Platelets: 211
RBC: 4.82
WBC: 4.9
WBC: 9.8

## 2011-05-29 LAB — PREGNANCY, URINE: Preg Test, Ur: NEGATIVE

## 2011-06-17 LAB — DIFFERENTIAL
Basophils Relative: 0
Eosinophils Absolute: 0.2
Eosinophils Relative: 3
Lymphs Abs: 1.4
Monocytes Absolute: 0.6
Monocytes Relative: 9
Neutrophils Relative %: 67

## 2011-06-17 LAB — URINALYSIS, ROUTINE W REFLEX MICROSCOPIC
Bilirubin Urine: NEGATIVE
Hgb urine dipstick: NEGATIVE
Nitrite: NEGATIVE
Specific Gravity, Urine: 1.019
pH: 7.5

## 2011-06-17 LAB — POCT PREGNANCY, URINE: Preg Test, Ur: NEGATIVE

## 2011-06-17 LAB — CBC
HCT: 34.3 — ABNORMAL LOW
MCHC: 34.1
MCV: 78.9
RBC: 4.35

## 2011-06-17 LAB — URINE MICROSCOPIC-ADD ON

## 2011-06-17 LAB — WET PREP, GENITAL: Trich, Wet Prep: NONE SEEN

## 2011-06-17 LAB — GC/CHLAMYDIA PROBE AMP, GENITAL: Chlamydia, DNA Probe: NEGATIVE

## 2011-09-20 ENCOUNTER — Encounter (HOSPITAL_BASED_OUTPATIENT_CLINIC_OR_DEPARTMENT_OTHER): Payer: Self-pay | Admitting: *Deleted

## 2011-09-20 ENCOUNTER — Emergency Department (HOSPITAL_BASED_OUTPATIENT_CLINIC_OR_DEPARTMENT_OTHER)
Admission: EM | Admit: 2011-09-20 | Discharge: 2011-09-20 | Disposition: A | Discharge status: Home / Self Care | Payer: Self-pay | Attending: Emergency Medicine | Admitting: Emergency Medicine

## 2011-09-20 DIAGNOSIS — B349 Viral infection, unspecified: Secondary | ICD-10-CM

## 2011-09-20 DIAGNOSIS — B9789 Other viral agents as the cause of diseases classified elsewhere: Secondary | ICD-10-CM | POA: Insufficient documentation

## 2011-09-20 DIAGNOSIS — R000 Tachycardia, unspecified: Secondary | ICD-10-CM | POA: Insufficient documentation

## 2011-09-20 DIAGNOSIS — R51 Headache: Secondary | ICD-10-CM | POA: Insufficient documentation

## 2011-09-20 HISTORY — DX: Malignant (primary) neoplasm, unspecified: C80.1

## 2011-09-20 LAB — CBC
MCV: 65.2 fL — ABNORMAL LOW (ref 78.0–100.0)
Platelets: 288 10*3/uL (ref 150–400)
RBC: 4.85 MIL/uL (ref 3.87–5.11)
RDW: 15.3 % (ref 11.5–15.5)
WBC: 9.2 10*3/uL (ref 4.0–10.5)

## 2011-09-20 LAB — COMPREHENSIVE METABOLIC PANEL
Alkaline Phosphatase: 56 U/L (ref 39–117)
BUN: 5 mg/dL — ABNORMAL LOW (ref 6–23)
CO2: 23 mEq/L (ref 19–32)
Chloride: 102 mEq/L (ref 96–112)
Creatinine, Ser: 0.7 mg/dL (ref 0.50–1.10)
GFR calc Af Amer: >90 mL/min (ref >90)
GFR calc non Af Amer: >90 mL/min (ref >90)
Glucose, Bld: 80 mg/dL (ref 70–99)
Potassium: 4.3 mEq/L (ref 3.5–5.1)
Total Bilirubin: 0.2 mg/dL — ABNORMAL LOW (ref 0.3–1.2)

## 2011-09-20 MED ORDER — HYDROMORPHONE HCL PF 1 MG/ML IJ SOLN
1.0000 mg | Freq: Once | INTRAMUSCULAR | Status: AC
  Administered 2011-09-20: 1 mg via INTRAVENOUS
  Filled 2011-09-20: qty 1

## 2011-09-20 MED ORDER — ONDANSETRON HCL 4 MG/2ML IJ SOLN
4.0000 mg | Freq: Once | INTRAMUSCULAR | Status: AC
  Administered 2011-09-20: 4 mg via INTRAVENOUS
  Filled 2011-09-20: qty 2

## 2011-09-20 MED ORDER — SODIUM CHLORIDE 0.9 % IV BOLUS (SEPSIS)
1000.0000 mL | Freq: Once | INTRAVENOUS | Status: AC
  Administered 2011-09-20: 1000 mL via INTRAVENOUS

## 2011-09-20 NOTE — ED Notes (Signed)
Pt states she has had a headache for 2 days with nausea and dizziness. Pain is around forehead and eyes. PERL

## 2011-09-20 NOTE — ED Provider Notes (Signed)
History   This chart was scribed for Hilario Quarry, MD by Melba Coon. The patient was seen in room MH02/MH02 and the patient's care was started at 8:20PM.    CSN: [id number]  Arrival date & time 09/20/11  1753   First MD Initiated Contact with Patient 09/20/11 2013      Chief Complaint  Patient presents with  . Headache    (Consider location/radiation/quality/duration/timing/severity/associated sxs/prior treatment) Patient is a 26 y.o. female presenting with headaches.  Headache    Alejandra Figueroa is a 26 y.o. female who presents to the Emergency Department complaining of constant, diffuse, throbbing, moderate to severe headache with associated fever with an onset today. Physical exertion aggravates the pain and light bothers her eyes; ibuprofen does not alleviate symptoms. Nobody at home is sick. Neck pain present. No n/v/d or congestion. No other health problems. Allergic to morphine. Pt is on birth control and not pregnant.    Past Medical History  Diagnosis Date  . Cancer     Past Surgical History  Procedure Date  . Ovary surgery   . Cystectomy     History reviewed. No pertinent family history.  History  Substance Use Topics  . Smoking status: Never Smoker   . Smokeless tobacco: Not on file  . Alcohol Use: No    OB History    Grav Para Term Preterm Abortions TAB SAB Ect Mult Living                  Review of Systems  Neurological: Positive for headaches.   10 Systems reviewed and are negative for acute change except as noted in the HPI.  Allergies  Morphine and related and Peanut-containing drug products  Home Medications   Current Outpatient Rx  Name Route Sig Dispense Refill  . IBUPROFEN 200 MG PO TABS Oral Take 600 mg by mouth every 6 (six) hours as needed. For pain    . NORETHINDRONE ACET-ETHINYL EST 1.5-30 MG-MCG PO TABS Oral Take 1 tablet by mouth daily.      BP 149/90  Pulse 112  Temp(Src) 100.5 F (38.1 C) (Oral)  Resp 20  Ht  5\' 6"  (1.676 m)  Wt 260 lb (117.935 kg)  BMI 41.97 kg/m2  SpO2 100%  LMP 08/23/2011  Physical Exam  Constitutional: She is oriented to person, place, and time. She appears well-developed and well-nourished.  HENT:  Head: Normocephalic and atraumatic.  Eyes: Conjunctivae and EOM are normal. Pupils are equal, round, and reactive to light. No scleral icterus.  Neck: Normal range of motion. Neck supple. No thyromegaly present.  Cardiovascular: Regular rhythm and normal heart sounds.  Tachycardia present.  Exam reveals no gallop and no friction rub.   No murmur heard. Pulmonary/Chest: Effort normal and breath sounds normal. No stridor. She has no wheezes. She has no rales. She exhibits no tenderness.  Abdominal: Soft. She exhibits no distension. There is no tenderness. There is no rebound.  Musculoskeletal: Normal range of motion. She exhibits no edema.  Lymphadenopathy:    She has no cervical adenopathy.  Neurological: She is alert and oriented to person, place, and time. Coordination normal.  Skin: Skin is warm. No rash noted. No erythema.  Psychiatric: She has a normal mood and affect. Her behavior is normal.    ED Course  Procedures (including critical care time)  DIAGNOSTIC STUDIES: Oxygen Saturation is 100% on room air, normal by my interpretation.    COORDINATION OF CARE:     Labs Reviewed -  No data to display No results found.   No diagnosis found.    MDM    I personally performed the services described in this documentation, which was scribed in my presence. The recorded information has been reviewed and considered.        Hilario Quarry, MD 09/20/11 2140

## 2012-12-09 ENCOUNTER — Encounter: Payer: Self-pay | Admitting: Obstetrics & Gynecology

## 2012-12-09 ENCOUNTER — Ambulatory Visit (INDEPENDENT_AMBULATORY_CARE_PROVIDER_SITE_OTHER): Payer: No Typology Code available for payment source | Admitting: Obstetrics & Gynecology

## 2012-12-09 VITALS — BP 123/85 | HR 98 | Temp 98.2°F | Ht 66.0 in | Wt 310.8 lb

## 2012-12-09 DIAGNOSIS — N979 Female infertility, unspecified: Secondary | ICD-10-CM | POA: Insufficient documentation

## 2012-12-09 DIAGNOSIS — Z01419 Encounter for gynecological examination (general) (routine) without abnormal findings: Secondary | ICD-10-CM

## 2012-12-09 DIAGNOSIS — N926 Irregular menstruation, unspecified: Secondary | ICD-10-CM | POA: Insufficient documentation

## 2012-12-09 LAB — POCT URINE PREGNANCY: Preg Test, Ur: NEGATIVE

## 2012-12-09 MED ORDER — METRONIDAZOLE 500 MG PO TABS: 500.0000 mg | ORAL_TABLET | Freq: Two times a day (BID) | ORAL | Status: DC

## 2012-12-09 NOTE — Progress Notes (Signed)
.   Subjective:     Alejandra Figueroa is a 27 y.o. female here for a routine exam.  Current complaints are irregular bleeding.  Patient would like to regulate her cycles so that she can conceive.  Personal health questionnaire reviewed: no. At ago at   See the infertility questionnaire. The patient is along history of irregular menses since menarche. Her cycles are regulated with COCPs. Her history is also remarkable for having undergone chemotherapy for a malignancy. Gynecologic History Patient's last menstrual period was 12/07/2012. Contraception: none Last Pap: 08/2008. Results were: normal Last mammogram: N/A.   The following portions of the patient's history were reviewed and updated as appropriate: allergies, current medications, past family history, past medical history, past social history, past surgical history and problem list.  Review of Systems Genitourinary:odor with urination    Objective:    General appearance: alert Breasts: normal appearance, no masses or tenderness Abdomen: soft, non-tender; bowel sounds normal; no masses,  no organomegaly Pelvic: cervix normal in appearance, external genitalia normal, no adnexal masses or tenderness, uterus irregular shape--limited by body habitus and vaginal discharge with amine odor  Assessment:   Irregular menses, oligo ovulation Morbid obesity Primary infertility, history of chemotherapy--partner's semen analysis normal per patient's report Likely bacterial vaginosis  Plan:   Treat empirically with metronidazole Review patient's partner's semen analysis Labs, return for a cycle day 21 progesterone Pelvic ultrasound Weight loss recommended with referral to a dietitian and the bariatric program Likely a candidate for ovulation induction within an aromatase inhibitor Return to review test results

## 2012-12-09 NOTE — Patient Instructions (Signed)
Health Maintenance, Females A healthy lifestyle and preventative care can promote health and wellness.  Maintain regular health, dental, and eye exams.  Eat a healthy diet. Foods like vegetables, fruits, whole grains, low-fat dairy products, and lean protein foods contain the nutrients you need without too many calories. Decrease your intake of foods high in solid fats, added sugars, and salt. Get information about a proper diet from your caregiver, if necessary.  Regular physical exercise is one of the most important things you can do for your health. Most adults should get at least 150 minutes of moderate-intensity exercise (any activity that increases your heart rate and causes you to sweat) each week. In addition, most adults need muscle-strengthening exercises on 2 or more days a week.   Maintain a healthy weight. The body mass index (BMI) is a screening tool to identify possible weight problems. It provides an estimate of body fat based on height and weight. Your caregiver can help determine your BMI, and can help you achieve or maintain a healthy weight. For adults 20 years and older:  A BMI below 18.5 is considered underweight.  A BMI of 18.5 to 24.9 is normal.  A BMI of 25 to 29.9 is considered overweight.  A BMI of 30 and above is considered obese.  Maintain normal blood lipids and cholesterol by exercising and minimizing your intake of saturated fat. Eat a balanced diet with plenty of fruits and vegetables. Blood tests for lipids and cholesterol should begin at age 20 and be repeated every 5 years. If your lipid or cholesterol levels are high, you are over 50, or you are a high risk for heart disease, you may need your cholesterol levels checked more frequently.Ongoing high lipid and cholesterol levels should be treated with medicines if diet and exercise are not effective.  If you smoke, find out from your caregiver how to quit. If you do not use tobacco, do not start.  If you  are pregnant, do not drink alcohol. If you are breastfeeding, be very cautious about drinking alcohol. If you are not pregnant and choose to drink alcohol, do not exceed 1 drink per day. One drink is considered to be 12 ounces (355 mL) of beer, 5 ounces (148 mL) of wine, or 1.5 ounces (44 mL) of liquor.  Avoid use of street drugs. Do not share needles with anyone. Ask for help if you need support or instructions about stopping the use of drugs.  High blood pressure causes heart disease and increases the risk of stroke. Blood pressure should be checked at least every 1 to 2 years. Ongoing high blood pressure should be treated with medicines, if weight loss and exercise are not effective.  If you are 55 to 28 years old, ask your caregiver if you should take aspirin to prevent strokes.  Diabetes screening involves taking a blood sample to check your fasting blood sugar level. This should be done once every 3 years, after age 45, if you are within normal weight and without risk factors for diabetes. Testing should be considered at a younger age or be carried out more frequently if you are overweight and have at least 1 risk factor for diabetes.  Breast cancer screening is essential preventative care for women. You should practice "breast self-awareness." This means understanding the normal appearance and feel of your breasts and may include breast self-examination. Any changes detected, no matter how small, should be reported to a caregiver. Women in their 20s and 30s should have   a clinical breast exam (CBE) by a caregiver as part of a regular health exam every 1 to 3 years. After age 40, women should have a CBE every year. Starting at age 40, women should consider having a mammogram (breast X-ray) every year. Women who have a family history of breast cancer should talk to their caregiver about genetic screening. Women at a high risk of breast cancer should talk to their caregiver about having an MRI and a  mammogram every year.  The Pap test is a screening test for cervical cancer. Women should have a Pap test starting at age 21. Between ages 21 and 29, Pap tests should be repeated every 2 years. Beginning at age 30, you should have a Pap test every 3 years as long as the past 3 Pap tests have been normal. If you had a hysterectomy for a problem that was not cancer or a condition that could lead to cancer, then you no longer need Pap tests. If you are between ages 65 and 70, and you have had normal Pap tests going back 10 years, you no longer need Pap tests. If you have had past treatment for cervical cancer or a condition that could lead to cancer, you need Pap tests and screening for cancer for at least 20 years after your treatment. If Pap tests have been discontinued, risk factors (such as a new sexual partner) need to be reassessed to determine if screening should be resumed. Some women have medical problems that increase the chance of getting cervical cancer. In these cases, your caregiver may recommend more frequent screening and Pap tests.  The human papillomavirus (HPV) test is an additional test that may be used for cervical cancer screening. The HPV test looks for the virus that can cause the cell changes on the cervix. The cells collected during the Pap test can be tested for HPV. The HPV test could be used to screen women aged 30 years and older, and should be used in women of any age who have unclear Pap test results. After the age of 30, women should have HPV testing at the same frequency as a Pap test.  Colorectal cancer can be detected and often prevented. Most routine colorectal cancer screening begins at the age of 50 and continues through age 75. However, your caregiver may recommend screening at an earlier age if you have risk factors for colon cancer. On a yearly basis, your caregiver may provide home test kits to check for hidden blood in the stool. Use of a small camera at the end of a  tube, to directly examine the colon (sigmoidoscopy or colonoscopy), can detect the earliest forms of colorectal cancer. Talk to your caregiver about this at age 50, when routine screening begins. Direct examination of the colon should be repeated every 5 to 10 years through age 75, unless early forms of pre-cancerous polyps or small growths are found.  Hepatitis C blood testing is recommended for all people born from 1945 through 1965 and any individual with known risks for hepatitis C.  Practice safe sex. Use condoms and avoid high-risk sexual practices to reduce the spread of sexually transmitted infections (STIs). Sexually active women aged 25 and younger should be checked for Chlamydia, which is a common sexually transmitted infection. Older women with new or multiple partners should also be tested for Chlamydia. Testing for other STIs is recommended if you are sexually active and at increased risk.  Osteoporosis is a disease in which the   bones lose minerals and strength with aging. This can result in serious bone fractures. The risk of osteoporosis can be identified using a bone density scan. Women ages 5 and over and women at risk for fractures or osteoporosis should discuss screening with their caregivers. Ask your caregiver whether you should be taking a calcium supplement or vitamin D to reduce the rate of osteoporosis.  Menopause can be associated with physical symptoms and risks. Hormone replacement therapy is available to decrease symptoms and risks. You should talk to your caregiver about whether hormone replacement therapy is right for you.  Use sunscreen with a sun protection factor (SPF) of 30 or greater. Apply sunscreen liberally and repeatedly throughout the day. You should seek shade when your shadow is shorter than you. Protect yourself by wearing long sleeves, pants, a wide-brimmed hat, and sunglasses year round, whenever you are outdoors.  Notify your caregiver of new moles or  changes in moles, especially if there is a change in shape or color. Also notify your caregiver if a mole is larger than the size of a pencil eraser.  Stay current with your immunizations. Document Released: 03/02/2011 Document Revised: 11/09/2011 Document Reviewed: 03/02/2011 [facility] Patient Information 2013 [place], [place].  Infertility WHAT IS INFERTILITY?  Infertility is usually defined as not being able to get pregnant after trying for one year of regular sexual intercourse without the use of contraceptives. Or not being able to carry a pregnancy to term and have a baby. The infertility rate in the [place] is around 10%. Pregnancy is the result of a chain of events. A woman must release an egg from one of her ovaries (ovulation). The egg must be fertilized by the female sperm. Then it travels through a fallopian tube into the uterus (womb), where it attaches to the wall of the uterus and grows. A man must have enough sperm, and the sperm must join with (fertilize) the egg along the way, at the proper time. The fertilized egg must then become attached to the inside of the uterus. While this may seem simple, many things can happen to prevent pregnancy from occurring.  WHOSE PROBLEM IS IT?  About 20% of infertility cases are due to problems with the man (female factors) and 65% are due to problems with the woman (female factors). Other cases are due to a combination of female and female factors or to unknown causes.  WHAT CAUSES INFERTILITY IN MEN?  Infertility in men is often caused by problems with making enough normal sperm or getting the sperm to reach the egg. Problems with sperm may exist from birth or develop later in life, due to illness or injury. Some men produce no sperm, or produce too few sperm (oligospermia). Other problems include:  Sexual dysfunction.  Hormonal or endocrine problems.  Age. Female fertility decreases with age, but not at as young an age as female  fertility.  Infection.  Congenital problems. Birth defect, such as absence of the tubes that carry the sperm (vas deferens).  Genetic/chromosomal problems.  Antisperm antibody problems.  Retrograde ejaculation (sperm go into the bladder).  Varicoceles, spematoceles, or tumors of the testicles.  Lifestyle can influence the number and quality of a man's sperm.  Alcohol and drugs can temporarily reduce sperm quality.  Environmental toxins, including pesticides and lead, may cause some cases of infertility in men. WHAT CAUSES INFERTILITY IN WOMEN?   Problems with ovulation account for most infertility in women. Without ovulation, eggs are not available to be fertilized.  Signs of problems with ovulation include irregular menstrual periods or no periods at all.  Simple lifestyle factors, including stress, diet, or athletic training, can affect a woman's hormonal balance.  Age. Fertility begins to decrease in women in the early 34s and is worse after age 68.  Much less often, a hormonal imbalance from a serious medical problem, such as a pituitary gland tumor, thyroid or other chronic medical disease, can cause ovulation problems.  Pelvic infections.  Polycystic ovary syndrome (increase in female hormones, unable to ovulate).  Alcohol or illegal drugs.  Environmental toxins, radiation, pesticides, and certain chemicals.  Aging is an important factor in female infertility.  The ability of a woman's ovaries to produce eggs declines with age, especially after age 11. About one third of couples where the woman is over 35 will have problems with fertility.  By the time she reaches menopause when her monthly periods stop for good, a woman can no longer produce eggs or become pregnant.  Other problems can also lead to infertility in women. If the fallopian tubes are blocked at one or both ends, the egg cannot travel through the tubes into the uterus. Scar tissue (adhesions) in the  pelvis may cause blocked tubes. This may result from pelvic inflammatory disease, endometriosis, or surgery for an ectopic pregnancy (fertilized egg implanted outside the uterus) or any pelvic or abdominal surgery causing adhesions.  Fibroid tumors or polyps of the uterus.  Congenital (birth defect) abnormalities of the uterus.  Infection of the cervix (cervicitis).  Cervical stenosis (narrowing).  Abnormal cervical mucus.  Polycystic ovary syndrome.  Having sexual intercourse too often (every other day or 4 to 5 times a week).  Obesity.  Anorexia.  Poor nutrition.  Over exercising, with loss of body fat.  DES. Your mother received diethylstilbesterol hormone when pregnant with you. HOW IS INFERTILITY TESTED?  If you have been trying to have a baby without success, you may want to seek medical help. You should not wait for one year of trying before seeing a health care provider if:  You are over 35.  You have reason to believe that there may be a fertility problem. A medical evaluation may determine the reasons for a couple's infertility. Usually this process begins with:  Physical exams.  Medical histories of both partners.  Sexual histories of both partners. If there is no obvious problem, like improperly timed intercourse or absence of ovulation, tests may be needed.   For a man, testing usually begins with tests of his semen to look at:  The number of sperm.  The shape of sperm.  Movement of his sperm.  Taking a complete medical and surgical history.  Physical examination.  Check for infection of the female reproductive organs. Sometimes hormone tests are done.   For a woman, the first step in testing is to find out if she is ovulating each month. There are several ways to do this. For example, she can keep track of changes in her morning body temperature and in the texture of her cervical mucus. Another tool is a home ovulation test kit, which can be bought  at drug or grocery stores.  Checks of ovulation can also be done in the doctor's office, using blood tests for hormone levels or ultrasound tests of the ovaries. If the woman is ovulating, more tests will need to be done. Some common female tests include:  Hysterosalpingogram: An x-ray of the fallopian tubes and uterus after they are injected with dye.  It shows if the tubes are open and shows the shape of the uterus.  Laparoscopy: An exam of the tubes and other female organs for disease. A lighted tube called a laparoscope is used to see inside the abdomen.  Endometrial biopsy: Sample of uterus tissue taken on the first day of the menstrual period, to see if the tissue indicates you are ovulating.  Transvaginal ultrasound: Examines the female organs.  Hysteroscopy: Uses a lighted tube to examine the cervix and inside the uterus, to see if there are any abnormalities inside the uterus. TREATMENT  Depending on the test results, different treatments can be suggested. The type of treatment depends on the cause. 85 to 90% of infertility cases are treated with drugs or surgery.   Various fertility drugs may be used for women with ovulation problems. It is important to talk with your caregiver about the drug to be used. You should understand the drug's benefits and side effects. Depending on the type of fertility drug and the dosage of the drug used, multiple births (twins or multiples) can occur in some women.  If needed, surgery can be done to repair damage to a woman's ovaries, fallopian tubes, cervix, or uterus.  Surgery or medical treatment for endometriosis or polycystic ovary syndrome. Sometimes a man has an infertility problem that can be corrected with medicine or by surgery.  Intrauterine insemination (IUI) of sperm, timed with ovulation.  Change in lifestyle, if that is the cause (lose weight, increase exercise, and stop smoking, drinking excessively, or taking illegal drugs).  Other  types of surgery:  Removing growths inside and on the uterus.  Removing scar tissue from inside of the uterus.  Fixing blocked tubes.  Removing scar tissue in the pelvis and around the female organs. WHAT IS ASSISTED REPRODUCTIVE TECHNOLOGY (ART)?  Assisted reproductive technology (ART) is another form of special methods used to help infertile couples. ART involves handling both the woman's eggs and the man's sperm. Success rates vary and depend on many factors. ART can be expensive and time-consuming. But ART has made it possible for many couples to have children that otherwise would not have been conceived. Some methods are listed below:  In vitro fertilization (IVF). IVF is often used when a woman's fallopian tubes are blocked or when a man has low sperm counts. A drug is used to stimulate the ovaries to produce multiple eggs. Once mature, the eggs are removed and placed in a culture dish with the man's sperm for fertilization. After about 40 hours, the eggs are examined to see if they have become fertilized by the sperm and are dividing into cells. These fertilized eggs (embryos) are then placed in the woman's uterus. This bypasses the fallopian tubes.  Gamete intrafallopian transfer (GIFT) is similar to IVF, but used when the woman has at least one normal fallopian tube. Three to five eggs are placed in the fallopian tube, along with the man's sperm, for fertilization inside the woman's body.  Zygote intrafallopian transfer (ZIFT), also called tubal embryo transfer, combines IVF and GIFT. The eggs retrieved from the woman's ovaries are fertilized in the lab and placed in the fallopian tubes rather than in the uterus.  ART procedures sometimes involve the use of donor eggs (eggs from another woman) or previously frozen embryos. Donor eggs may be used if a woman has impaired ovaries or has a genetic disease that could be passed on to her baby.  When performing ART, you are at higher risk for  resulting in multiple pregnancies, twins, triplets or more.  Intracytoplasma sperm injection is a procedure that injects a single sperm into the egg to fertilize it.  Embryo transplant is a procedure that starts after growing an embryo in a special media (chemical solution) developed to keep the embryo alive for 2 to 5 days, and then transplanting it into the uterus. In cases where a cause cannot be found and pregnancy does not occur, adoption may be a consideration. Document Released: 08/20/2003 Document Revised: 11/09/2011 Document Reviewed: 07/16/2009 [facility] Patient Information 2013 [place], [place].

## 2012-12-12 LAB — TESTOSTERONE, FREE, TOTAL, SHBG
Sex Hormone Binding: 32 nmol/L (ref 18–114)
Testosterone, Free: 9.2 pg/mL — ABNORMAL HIGH (ref 0.6–6.8)

## 2012-12-12 LAB — CBC
HCT: 35.4 % — ABNORMAL LOW (ref 36.0–46.0)
MCV: 80.1 fL (ref 78.0–100.0)
Platelets: 310 10*3/uL (ref 150–400)
RBC: 4.42 MIL/uL (ref 3.87–5.11)
RDW: 15.7 % — ABNORMAL HIGH (ref 11.5–15.5)
WBC: 5.5 10*3/uL (ref 4.0–10.5)

## 2012-12-12 LAB — COMPREHENSIVE METABOLIC PANEL
ALT: 10 U/L (ref 0–35)
AST: 14 U/L (ref 0–37)
Albumin: 4 g/dL (ref 3.5–5.2)
CO2: 26 mEq/L (ref 19–32)
Calcium: 9 mg/dL (ref 8.4–10.5)
Chloride: 102 mEq/L (ref 96–112)
Potassium: 4.2 mEq/L (ref 3.5–5.3)

## 2012-12-12 LAB — HEMOGLOBIN A1C: Hgb A1c MFr Bld: 5.4 % (ref <5.7)

## 2012-12-12 LAB — PROLACTIN: Prolactin: 16.4 ng/mL

## 2012-12-13 LAB — PAP IG, CT-NG, RFX HPV ASCU: GC Probe Amp: NEGATIVE

## 2012-12-14 LAB — ANTI MULLERIAN HORMONE: AMH AssessR: 0.94 ng/mL

## 2012-12-15 ENCOUNTER — Ambulatory Visit (HOSPITAL_COMMUNITY): Payer: No Typology Code available for payment source

## 2012-12-16 LAB — 17-HYDROXYPROGESTERONE: 17-OH-Progesterone, LC/MS/MS: 42 ng/dL

## 2012-12-21 ENCOUNTER — Ambulatory Visit (HOSPITAL_COMMUNITY)
Admission: RE | Admit: 2012-12-21 | Discharge: 2012-12-21 | Disposition: A | Discharge status: Home / Self Care | Payer: No Typology Code available for payment source | Source: Ambulatory Visit | Attending: Obstetrics & Gynecology | Admitting: Obstetrics & Gynecology

## 2012-12-21 DIAGNOSIS — N979 Female infertility, unspecified: Secondary | ICD-10-CM | POA: Insufficient documentation

## 2012-12-21 DIAGNOSIS — N938 Other specified abnormal uterine and vaginal bleeding: Secondary | ICD-10-CM | POA: Insufficient documentation

## 2012-12-21 DIAGNOSIS — N949 Unspecified condition associated with female genital organs and menstrual cycle: Secondary | ICD-10-CM | POA: Insufficient documentation

## 2012-12-21 DIAGNOSIS — R9389 Abnormal findings on diagnostic imaging of other specified body structures: Secondary | ICD-10-CM | POA: Insufficient documentation

## 2012-12-21 DIAGNOSIS — N926 Irregular menstruation, unspecified: Secondary | ICD-10-CM

## 2012-12-21 DIAGNOSIS — D259 Leiomyoma of uterus, unspecified: Secondary | ICD-10-CM | POA: Insufficient documentation

## 2012-12-29 ENCOUNTER — Encounter: Payer: Self-pay | Admitting: Obstetrics & Gynecology

## 2012-12-29 ENCOUNTER — Ambulatory Visit (INDEPENDENT_AMBULATORY_CARE_PROVIDER_SITE_OTHER): Payer: No Typology Code available for payment source | Admitting: Obstetrics & Gynecology

## 2012-12-29 VITALS — BP 132/84 | HR 92 | Temp 97.8°F | Ht 66.0 in | Wt 307.0 lb

## 2012-12-29 DIAGNOSIS — R9389 Abnormal findings on diagnostic imaging of other specified body structures: Secondary | ICD-10-CM

## 2012-12-29 NOTE — Patient Instructions (Addendum)
Hysteroscopy  Hysteroscopy is a procedure used for looking inside the womb (uterus). It may be done for many different reasons, including:  · To evaluate abnormal bleeding, fibroid (benign, noncancerous) tumors, polyps, scar tissue (adhesions), and possibly cancer of the uterus.  · To look for lumps (tumors) and other uterine growths.  · To look for causes of why a woman cannot get pregnant (infertility), causes of recurrent loss of pregnancy (miscarriages), or a lost intrauterine device (IUD).  · To perform a sterilization by blocking the fallopian tubes from inside the uterus.  A hysteroscopy should be done right after a menstrual period to be sure you are not pregnant.  LET YOUR CAREGIVER KNOW ABOUT:   · Allergies.  · Medicines taken, including herbs, eyedrops, over-the-counter medicines, and creams.  · Use of steroids (by mouth or creams).  · Previous problems with anesthetics or numbing medicines.  · History of bleeding or blood problems.  · History of blood clots.  · Possibility of pregnancy, if this applies.  · Previous surgery.  · Other health problems.  RISKS AND COMPLICATIONS   · Putting a hole in the uterus.  · Excessive bleeding.  · Infection.  · Damage to the cervix.  · Injury to other organs.  · Allergic reaction to medicines.  · Too much fluid used in the uterus for the procedure.  BEFORE THE PROCEDURE   · Do not take aspirin or blood thinners for a week before the procedure, or as directed. It can cause bleeding.  · Arrive at least 60 minutes before the procedure or as directed to read and sign the necessary forms.  · Arrange for someone to take you home after the procedure.  · If you smoke, do not smoke for 2 weeks before the procedure.  PROCEDURE   · Your caregiver may give you medicine to relax you. He or she may also give you a medicine that numbs the area around the cervix (local anesthetic) or a medicine that makes you sleep (general anesthesia).  · Sometimes, a medicine is placed in the cervix  the day before the procedure. This medicine makes the cervix have a larger opening (dilate). This makes it easier for the instrument to be inserted into the uterus.  · A small instrument (hysteroscope) is inserted through the vagina into the uterus. This instrument is similar to a pencil-sized telescope with a light.  · During the procedure, air or a liquid is put into the uterus, which allows the surgeon to see better.  · Sometimes, tissue is gently scraped from inside the uterus. These tissue samples are sent to a specialist who looks at tissue samples (pathologist). The pathologist will give a report to your caregiver. This will help your caregiver decide if further treatment is necessary. The report will also help your caregiver decide on the best treatment if the test comes back abnormal.  AFTER THE PROCEDURE   · If you had a general anesthetic, you may be groggy for a couple hours after the procedure.  · If you had a local anesthetic, you will be advised to rest at the surgical center or caregiver's office until you are stable and feel ready to go home.  · You may have some cramping for a couple days.  · You may have bleeding, which varies from light spotting for a few days to menstrual-like bleeding for up to 3 to 7 days. This is normal.  · Have someone take you home.  FINDING OUT THE   RESULTS OF YOUR TEST  Not all test results are available during your visit. If your test results are not back during the visit, make an appointment with your caregiver to find out the results. Do not assume everything is normal if you have not heard from your caregiver or the medical facility. It is important for you to follow up on all of your test results.  HOME CARE INSTRUCTIONS   · Do not drive for 24 hours or as instructed.  · Only take over-the-counter or prescription medicines for pain, discomfort, or fever as directed by your caregiver.  · Do not take aspirin. It can cause or aggravate bleeding.  · Do not drive or drink  alcohol while taking pain medicine.  · You may resume your usual diet.  · Do not use tampons, douche, or have sexual intercourse for 2 weeks, or as advised by your caregiver.  · Rest and sleep for the first 24 to 48 hours.  · Take your temperature twice a day for 4 to 5 days. Write it down. Give these temperatures to your caregiver if they are abnormal (above 98.6° F or 37.0° C).  · Take medicines your caregiver has ordered as directed.  · Follow your caregiver's advice regarding diet, exercise, lifting, driving, and general activities.  · Take showers instead of baths for 2 weeks, or as recommended by your caregiver.  · If you develop constipation:  · Take a mild laxative with the advice of your caregiver.  · Eat bran foods.  · Drink enough water and fluids to keep your urine clear or pale yellow.  · Try to have someone with you or available to you for the first 24 to 48 hours, especially if you had a general anesthetic.  · Make sure you and your family understand everything about your operation and recovery.  · Follow your caregiver's advice regarding follow-up appointments and Pap smears.  SEEK MEDICAL CARE IF:   · You feel dizzy or lightheaded.  · You feel sick to your stomach (nauseous).  · You develop abnormal vaginal discharge.  · You develop a rash.  · You have an abnormal reaction or allergy to your medicine.  · You need stronger pain medicine.  SEEK IMMEDIATE MEDICAL CARE IF:   · Bleeding is heavier than a normal menstrual period or you have blood clots.  · You have an oral temperature above 102° F (38.9° C), not controlled by medicine.  · You have increasing cramps or pains not relieved with medicine.  · You develop belly (abdominal) pain that does not seem to be related to the same area of earlier cramping and pain.  · You pass out.  · You develop pain in the tops of your shoulders (shoulder strap areas).  · You develop shortness of breath.  MAKE SURE YOU:   · Understand these instructions.  · Will watch  your condition.  · Will get help right away if you are not doing well or get worse.  Document Released: 11/23/2000 Document Revised: 11/09/2011 Document Reviewed: 03/18/2009  ExitCare® Patient Information ©2013 [employer].

## 2012-12-29 NOTE — Progress Notes (Signed)
Subjective:     Alejandra Figueroa is a 27 y.o. female here for follow up of irregular menstrual cycle and to discuss U/S results.  Current complaints: none.  Personal health questionnaire reviewed: not asked.   Gynecologic History Patient's last menstrual period was 12/07/2012. Contraception: OCP (estrogen/progesterone)  The following portions of the patient's history were reviewed and updated as appropriate: allergies, current medications, past family history, past medical history, past social history, past surgical history and problem list.  Review of Systems Pertinent items are noted in HPI.    Objective:   No exam today  Assessment:   Thickened endometrium   Plan:   Diagnostic D&C, hyseroscopy

## 2012-12-30 ENCOUNTER — Encounter: Payer: Self-pay | Admitting: Obstetrics & Gynecology

## 2012-12-30 DIAGNOSIS — R9389 Abnormal findings on diagnostic imaging of other specified body structures: Secondary | ICD-10-CM | POA: Insufficient documentation

## 2013-01-03 ENCOUNTER — Other Ambulatory Visit: Payer: Self-pay | Admitting: *Deleted

## 2013-01-05 ENCOUNTER — Encounter (HOSPITAL_COMMUNITY): Payer: Self-pay | Admitting: Pharmacy Technician

## 2013-01-05 ENCOUNTER — Encounter (HOSPITAL_COMMUNITY): Payer: Self-pay | Admitting: *Deleted

## 2013-01-18 ENCOUNTER — Encounter (HOSPITAL_COMMUNITY)
Admission: RE | Disposition: A | Discharge status: Home / Self Care | Payer: Self-pay | Source: Ambulatory Visit | Attending: Obstetrics & Gynecology

## 2013-01-18 ENCOUNTER — Ambulatory Visit (HOSPITAL_COMMUNITY)
Admission: RE | Admit: 2013-01-18 | Discharge: 2013-01-18 | Disposition: A | Discharge status: Home / Self Care | Payer: No Typology Code available for payment source | Source: Ambulatory Visit | Attending: Obstetrics & Gynecology | Admitting: Obstetrics & Gynecology

## 2013-01-18 ENCOUNTER — Encounter (HOSPITAL_COMMUNITY): Payer: Self-pay | Admitting: Anesthesiology

## 2013-01-18 ENCOUNTER — Ambulatory Visit (HOSPITAL_COMMUNITY): Payer: No Typology Code available for payment source | Admitting: Anesthesiology

## 2013-01-18 DIAGNOSIS — R9389 Abnormal findings on diagnostic imaging of other specified body structures: Secondary | ICD-10-CM

## 2013-01-18 DIAGNOSIS — N949 Unspecified condition associated with female genital organs and menstrual cycle: Secondary | ICD-10-CM | POA: Insufficient documentation

## 2013-01-18 DIAGNOSIS — N938 Other specified abnormal uterine and vaginal bleeding: Secondary | ICD-10-CM | POA: Insufficient documentation

## 2013-01-18 DIAGNOSIS — N970 Female infertility associated with anovulation: Secondary | ICD-10-CM | POA: Insufficient documentation

## 2013-01-18 DIAGNOSIS — N915 Oligomenorrhea, unspecified: Secondary | ICD-10-CM | POA: Insufficient documentation

## 2013-01-18 HISTORY — DX: Other seasonal allergic rhinitis: J30.2

## 2013-01-18 HISTORY — DX: Anemia, unspecified: D64.9

## 2013-01-18 HISTORY — DX: Nausea with vomiting, unspecified: R11.2

## 2013-01-18 HISTORY — DX: Other specified postprocedural states: Z98.890

## 2013-01-18 HISTORY — PX: HYSTEROSCOPY WITH D & C: SHX1775

## 2013-01-18 LAB — CBC
Hemoglobin: 11 g/dL — ABNORMAL LOW (ref 12.0–15.0)
MCHC: 33 g/dL (ref 30.0–36.0)
RDW: 13.6 % (ref 11.5–15.5)

## 2013-01-18 LAB — PREGNANCY, URINE: Preg Test, Ur: NEGATIVE

## 2013-01-18 SURGERY — DILATATION AND CURETTAGE /HYSTEROSCOPY
Anesthesia: Spinal | Site: Vagina | Wound class: Clean Contaminated

## 2013-01-18 MED ORDER — LIDOCAINE HCL 1 % IJ SOLN
INTRAMUSCULAR | Status: DC | PRN
  Administered 2013-01-18: 10 mL

## 2013-01-18 MED ORDER — ONDANSETRON HCL 4 MG/2ML IJ SOLN
INTRAMUSCULAR | Status: AC
  Filled 2013-01-18: qty 2

## 2013-01-18 MED ORDER — SODIUM CHLORIDE 0.9 % IJ SOLN: 3.0000 mL | INTRAMUSCULAR | Status: DC | PRN

## 2013-01-18 MED ORDER — MIDAZOLAM HCL 2 MG/2ML IJ SOLN
INTRAMUSCULAR | Status: AC
  Filled 2013-01-18: qty 2

## 2013-01-18 MED ORDER — OXYCODONE HCL 5 MG PO TABS: 5.0000 mg | ORAL_TABLET | ORAL | Status: DC | PRN

## 2013-01-18 MED ORDER — ONDANSETRON HCL 4 MG/2ML IJ SOLN
INTRAMUSCULAR | Status: DC | PRN
  Administered 2013-01-18: 4 mg via INTRAVENOUS

## 2013-01-18 MED ORDER — BUPIVACAINE IN DEXTROSE 0.75-8.25 % IT SOLN
INTRATHECAL | Status: DC | PRN
  Administered 2013-01-18: 1.4 mL via INTRATHECAL

## 2013-01-18 MED ORDER — LACTATED RINGERS IV SOLN
INTRAVENOUS | Status: DC
  Administered 2013-01-18 (×2): via INTRAVENOUS

## 2013-01-18 MED ORDER — MIDAZOLAM HCL 5 MG/5ML IJ SOLN
INTRAMUSCULAR | Status: DC | PRN
  Administered 2013-01-18 (×4): 1 mg via INTRAVENOUS

## 2013-01-18 MED ORDER — KETOROLAC TROMETHAMINE 30 MG/ML IJ SOLN: 30.0000 mg | Freq: Once | INTRAMUSCULAR | Status: AC

## 2013-01-18 MED ORDER — SODIUM CHLORIDE 0.9 % IV SOLN: 250.0000 mL | INTRAVENOUS | Status: DC | PRN

## 2013-01-18 MED ORDER — FENTANYL CITRATE 0.05 MG/ML IJ SOLN: 25.0000 ug | INTRAMUSCULAR | Status: DC | PRN

## 2013-01-18 MED ORDER — PHENYLEPHRINE HCL 10 MG/ML IJ SOLN
INTRAMUSCULAR | Status: DC | PRN
  Administered 2013-01-18: 80 ug via INTRAVENOUS
  Administered 2013-01-18 (×2): 40 ug via INTRAVENOUS
  Administered 2013-01-18: 120 ug via INTRAVENOUS
  Administered 2013-01-18: 80 ug via INTRAVENOUS
  Administered 2013-01-18: 40 ug via INTRAVENOUS

## 2013-01-18 MED ORDER — SODIUM CHLORIDE 0.9 % IJ SOLN: 3.0000 mL | Freq: Two times a day (BID) | INTRAMUSCULAR | Status: DC

## 2013-01-18 MED ORDER — ONDANSETRON HCL 4 MG/2ML IJ SOLN: 4.0000 mg | Freq: Four times a day (QID) | INTRAMUSCULAR | Status: DC | PRN

## 2013-01-18 MED ORDER — PHENYLEPHRINE 40 MCG/ML (10ML) SYRINGE FOR IV PUSH (FOR BLOOD PRESSURE SUPPORT)
PREFILLED_SYRINGE | INTRAVENOUS | Status: AC
  Filled 2013-01-18: qty 5

## 2013-01-18 MED ORDER — KETOROLAC TROMETHAMINE 30 MG/ML IJ SOLN
INTRAMUSCULAR | Status: AC
  Administered 2013-01-18: 30 mg via INTRAVENOUS
  Filled 2013-01-18: qty 1

## 2013-01-18 MED ORDER — ACETAMINOPHEN 650 MG RE SUPP
650.0000 mg | RECTAL | Status: DC | PRN
  Filled 2013-01-18: qty 1

## 2013-01-18 MED ORDER — ACETAMINOPHEN 325 MG PO TABS: 650.0000 mg | ORAL_TABLET | ORAL | Status: DC | PRN

## 2013-01-18 MED ORDER — GLYCINE 1.5 % IR SOLN
Status: DC | PRN
  Administered 2013-01-18: 3000 mL

## 2013-01-18 MED ORDER — OXYCODONE-ACETAMINOPHEN 5-325 MG PO TABS: 2.0000 | ORAL_TABLET | Freq: Four times a day (QID) | ORAL | Status: DC | PRN

## 2013-01-18 MED ORDER — PHENYLEPHRINE 40 MCG/ML (10ML) SYRINGE FOR IV PUSH (FOR BLOOD PRESSURE SUPPORT)
PREFILLED_SYRINGE | INTRAVENOUS | Status: AC
Start: 2013-01-18 — End: 2013-01-18
  Filled 2013-01-18: qty 5

## 2013-01-18 SURGICAL SUPPLY — 17 items
CANISTER SUCTION 2500CC (MISCELLANEOUS) ×2 IMPLANT
CATH ROBINSON RED A/P 16FR (CATHETERS) ×2 IMPLANT
CLOTH BEACON ORANGE TIMEOUT ST (SAFETY) ×2 IMPLANT
CONTAINER PREFILL 10% NBF 60ML (FORM) ×2 IMPLANT
DILATOR CANAL MILEX (MISCELLANEOUS) ×2 IMPLANT
DRESSING TELFA 8X3 (GAUZE/BANDAGES/DRESSINGS) ×2 IMPLANT
ELECT REM PT RETURN 9FT ADLT (ELECTROSURGICAL) ×2
ELECTRODE REM PT RTRN 9FT ADLT (ELECTROSURGICAL) ×1 IMPLANT
GLOVE BIO SURGEON STRL SZ 6.5 (GLOVE) ×2 IMPLANT
GOWN STRL REIN XL XLG (GOWN DISPOSABLE) ×4 IMPLANT
LOOP ANGLED CUTTING 22FR (CUTTING LOOP) IMPLANT
NEEDLE SPNL 20GX3.5 QUINCKE YW (NEEDLE) ×2 IMPLANT
PACK HYSTEROSCOPY LF (CUSTOM PROCEDURE TRAY) ×2 IMPLANT
PAD OB MATERNITY 4.3X12.25 (PERSONAL CARE ITEMS) ×2 IMPLANT
SCRUB PCMX 4 OZ (MISCELLANEOUS) IMPLANT
TOWEL OR 17X24 6PK STRL BLUE (TOWEL DISPOSABLE) ×4 IMPLANT
WATER STERILE IRR 1000ML POUR (IV SOLUTION) ×2 IMPLANT

## 2013-01-18 NOTE — Anesthesia Preprocedure Evaluation (Signed)
Anesthesia Evaluation  Patient identified by MRN, date of birth, ID band Patient awake    Reviewed: Allergy & Precautions, H&P , Patient's Chart, lab work & pertinent test results  Airway Mallampati: IV TM Distance: >3 FB Neck ROM: full   Comment: Large tonsils, OSA sx, Increased weight gain since last GA. Will lean toward [facility] no notable dental hx.    Pulmonary  breath sounds clear to auscultation  Pulmonary exam normal       Cardiovascular Exercise Tolerance: Good Rhythm:regular Rate:Normal     Neuro/Psych    GI/Hepatic   Endo/Other    Renal/GU      Musculoskeletal   Abdominal   Peds  Hematology   Anesthesia Other Findings   Reproductive/Obstetrics                           Anesthesia Physical Anesthesia Plan  ASA: III  Anesthesia Plan: Spinal   Post-op Pain Management:    Induction:   Airway Management Planned:   Additional Equipment:   Intra-op Plan:   Post-operative Plan:   Informed Consent: I have reviewed the patients History and Physical, chart, labs and discussed the procedure including the risks, benefits and alternatives for the proposed anesthesia with the patient or authorized representative who has indicated his/her understanding and acceptance.   Dental Advisory Given  Plan Discussed with: CRNA  Anesthesia Plan Comments: (Lab work confirmed with CRNA in room. Platelets okay. Discussed spinal anesthetic, and patient consents to the procedure:  included risk of possible headache,backache, failed block, allergic reaction, and nerve injury. This patient was asked if she had any questions or concerns before the procedure started. )        Anesthesia Quick Evaluation

## 2013-01-18 NOTE — Anesthesia Procedure Notes (Signed)
Spinal  Patient location during procedure: OR Start time: 01/18/2013 12:04 PM Staffing Anesthesiologist: Marsa Matteo A. Performed by: anesthesiologist  Preanesthetic Checklist Completed: patient identified, site marked, surgical consent, pre-op evaluation, timeout performed, IV checked, risks and benefits discussed and monitors and equipment checked Spinal Block Patient position: sitting Prep: site prepped and draped and DuraPrep Patient monitoring: heart rate, cardiac monitor, continuous pulse ox and blood pressure Approach: midline Location: L3-4 Injection technique: single-shot Needle Needle type: Sprotte and Tuohy  Needle gauge: 24 G Needle length: 12.7 cm Assessment Sensory level: T4 Additional Notes Patient tolerated procedure well. Attempt x 1 with spinal needle then CSE performed. Adequate sensory level.

## 2013-01-18 NOTE — Anesthesia Postprocedure Evaluation (Signed)
  Anesthesia Post-op Note  Patient: Alejandra Figueroa  Procedure(s) Performed: Procedure(s) with comments: DILATATION AND CURETTAGE /HYSTEROSCOPY, diagnostic (N/A) - diagnostic  Patient Location: PACU  Anesthesia Type:Spinal  Level of Consciousness: awake, alert  and oriented  Airway and Oxygen Therapy: Patient Spontanous Breathing  Post-op Pain: none  Post-op Assessment: Post-op Vital signs reviewed, Patient's Cardiovascular Status Stable, Respiratory Function Stable, Patent Airway, No signs of Nausea or vomiting, Pain level controlled, No headache and No backache  Post-op Vital Signs: Reviewed and stable  Complications: No apparent anesthesia complications

## 2013-01-18 NOTE — Transfer of Care (Signed)
Immediate Anesthesia Transfer of Care Note  Patient: Alejandra Figueroa  Procedure(s) Performed: Procedure(s) with comments: DILATATION AND CURETTAGE /HYSTEROSCOPY, diagnostic (N/A) - diagnostic  Patient Location: PACU  Anesthesia Type:Spinal  Level of Consciousness: awake, alert , oriented and patient cooperative  Airway & Oxygen Therapy: Patient Spontanous Breathing  Post-op Assessment: Report given to PACU RN and Post -op Vital signs reviewed and stable  Post vital signs: Reviewed and stable  Complications: No apparent anesthesia complications

## 2013-01-18 NOTE — Op Note (Signed)
Preoperative diagnosis: Abnormal uterine bleeding, thickened endometrium  Postoperative diagnosis: Same  Procedure: Diagnostic hysteroscopy, dilatation and curettage  Surgeon: Antionette Char A  Anesthesia:spinal, paracervical block  Estimated blood loss: Minimal  Urine output: 50 ml  IV Fluids: per Anesthesthesiology  Complications: none  Specimen: PATHOLOGY  Operative Findings: Polypoid endometrium involving the posterior and lateral walls.  No discrete lesions.  Description of procedure:   The patient was taken to the operating room and placed on the operating table in the semi-lithotomy position in [place] stirrups.  Examination under anesthesia was performed.  The patient was prepped and draped in the usual manner.  After a time-out had been completed, a speculum was placed in the vagina.  The anterior lip of the cervix was grasped with a single-toothed tenaculum.    10 cc of 1% lidocaine were injected at 4 and 7 o'clock to produce a paracervical block.  The uterine cavity sounded to 11 cm.  The endocervical canal was dilated with Shawnie Pons dilators.  A 5 mm diagnostic hysteroscope with Glycine as the distending medium was used to perform a diagnostic hysteroscopy.  The findings are noted above.    The hysteroscope was removed.  A small, Sims curette was used to perform an endometrial curettage.  All the instruments were removed from the vagina.  Final instrument counts were correct.  The patient was taken to the PACU in stable condition.

## 2013-01-18 NOTE — OR Nursing (Signed)
Dr Malen Gauze ordered to In and Out catheter pt. Bladder scan showed around 450cc pf urine. Been unsuccessful several times to void on her own. Both legs are moving well now .

## 2013-01-18 NOTE — H&P (Signed)
  Chief Complaint: 27 y.o.  who presents with an U/S that showed a thickened endometrial lining  Details of Present Illness: There is a h/o oligoovulation/oligomenorrhea.  See above.  BP 137/77  Pulse 90  Temp(Src) 98.4 F (36.9 C) (Oral)  Resp 18  Ht 5\' 6"  (1.676 m)  Wt 307 lb (139.254 kg)  BMI 49.57 kg/m2  LMP 01/02/2013  Past Medical History  Diagnosis Date  . PONV (postoperative nausea and vomiting)   . Seasonal allergies   . Cancer 2006    breast  . Anemia    History   Social History  . Marital Status: Married    Spouse Name: [employer]    Number of Children: N/A  . Years of Education: N/A   Occupational History  . Unemployed    Social History Main Topics  . Smoking status: Never Smoker   . Smokeless tobacco: Never Used  . Alcohol Use: No  . Drug Use: No  . Sexually Active: Yes -- Female partner(s)    Birth Control/ Protection: Pill   Other Topics Concern  . Not on file   Social History Narrative  . No narrative on file   Family History  Problem Relation Age of Onset  . Cancer Father     prostate    Pertinent items are noted in HPI.  Pre-Op Diagnosis: THICKENED ENDOMETRIUM   Planned Procedure: Procedure(s): DILATATION AND CURETTAGE /HYSTEROSCOPY, POSSIBLE TRUCLER  I have reviewed the patient's history and have completed the physical exam and Angeni L Mcphail is acceptable for surgery.  Roseanna Rainbow, MD 01/18/2013 11:30 AM

## 2013-01-19 ENCOUNTER — Encounter (HOSPITAL_COMMUNITY): Payer: Self-pay | Admitting: Obstetrics & Gynecology

## 2013-02-08 ENCOUNTER — Encounter: Payer: Self-pay | Admitting: Obstetrics & Gynecology

## 2013-02-08 ENCOUNTER — Ambulatory Visit (INDEPENDENT_AMBULATORY_CARE_PROVIDER_SITE_OTHER): Payer: No Typology Code available for payment source | Admitting: Obstetrics & Gynecology

## 2013-02-08 VITALS — BP 139/96 | HR 112 | Temp 98.8°F | Wt 306.0 lb

## 2013-02-08 DIAGNOSIS — E282 Polycystic ovarian syndrome: Secondary | ICD-10-CM | POA: Insufficient documentation

## 2013-02-08 MED ORDER — LETROZOLE 2.5 MG PO TABS: 2.5000 mg | ORAL_TABLET | Freq: Every day | ORAL | Status: DC

## 2013-02-08 MED ORDER — MEDROXYPROGESTERONE ACETATE 5 MG PO TABS: 5.0000 mg | ORAL_TABLET | Freq: Every day | ORAL | Status: DC

## 2013-02-08 NOTE — Patient Instructions (Signed)
Polycystic Ovarian Syndrome  Polycystic ovarian syndrome is a condition with a number of problems. One problem is with the ovaries. The ovaries are organs located in the female pelvis, on each side of the uterus. Usually, during the menstrual cycle, an egg is released from 1 ovary every month. This is called ovulation. When the egg is fertilized, it goes into the womb (uterus), which allows for the growth of a baby. The egg travels from the ovary through the fallopian tube to the uterus. The ovaries also make the hormones estrogen and progesterone. These hormones help the development of a woman's breasts, body shape, and body hair. They also regulate the menstrual cycle and pregnancy.  Sometimes, cysts form in the ovaries. A cyst is a fluid-filled sac. On the ovary, different types of cysts can form. The most common type of ovarian cyst is called a functional or ovulation cyst. It is normal, and often forms during the normal menstrual cycle. Each month, a woman's ovaries grow tiny cysts that hold the eggs. When an egg is fully grown, the sac breaks open. This releases the egg. Then, the sac which released the egg from the ovary dissolves. In one type of functional cyst, called a follicle cyst, the sac does not break open to release the egg. It may actually continue to grow. This type of cyst usually disappears within 1 to 3 months.   One type of cyst problem with the ovaries is called Polycystic Ovarian Syndrome (PCOS). In this condition, many follicle cysts form, but do not rupture and produce an egg. This health problem can affect the following:  · Menstrual cycle.  · Heart.  · Obesity.  · Cancer of the uterus.  · Fertility.  · Blood vessels.  · Hair growth (face and body) or baldness.  · Hormones.  · Appearance.  · High blood pressure.  · Stroke.  · Insulin production.  · Inflammation of the liver.  · Elevated blood cholesterol and triglycerides.  CAUSES   · No one knows the exact cause of PCOS.  · Women with  PCOS often have a mother or sister with PCOS. There is not yet enough proof to say this is inherited.  · Many women with PCOS have a weight problem.  · Researchers are looking at the relationship between PCOS and the body's ability to make insulin. Insulin is a hormone that regulates the change of sugar, starches, and other food into energy for the body's use, or for storage. Some women with PCOS make too much insulin. It is possible that the ovaries react by making too many female hormones, called androgens. This can lead to acne, excessive hair growth, weight gain, and ovulation problems.  · Too much production of luteinizing hormone (LH) from the pituitary gland in the brain stimulates the ovary to produce too much female hormone (androgen).  SYMPTOMS   · Infrequent or no menstrual periods, and/or irregular bleeding.  · Inability to get pregnant (infertility), because of not ovulating.  · Increased growth of hair on the face, chest, stomach, back, thumbs, thighs, or toes.  · Acne, oily skin, or dandruff.  · Pelvic pain.  · Weight gain or obesity, usually carrying extra weight around the waist.  · Type 2 diabetes (this is the diabetes that usually does not need insulin).  · High cholesterol.  · High blood pressure.  · Female-pattern baldness or thinning hair.  · Patches of thickened and dark brown or black skin on the neck, arms, breasts,   or thighs.  · Skin tags, or tiny excess flaps of skin, in the armpits or neck area.  · Sleep apnea (excessive snoring and breathing stops at times while asleep).  · Deepening of the voice.  · Gestational diabetes when pregnant.  · Increased risk of miscarriage with pregnancy.  DIAGNOSIS   There is no single test to diagnose PCOS.   · Your caregiver will:  · Take a medical history.  · Perform a pelvic exam.  · Perform an ultrasound.  · Check your female and female hormone levels.  · Measure glucose or sugar levels in the blood.  · Do other blood tests.  · If you are producing too many  female hormones, your caregiver will make sure it is from PCOS. At the physical exam, your caregiver will want to evaluate the areas of increased hair growth. Try to allow natural hair growth for a few days before the visit.  · During a pelvic exam, the ovaries may be enlarged or swollen by the increased number of small cysts. This can be seen more easily by vaginal ultrasound or screening, to examine the ovaries and lining of the uterus (endometrium) for cysts. The uterine lining may become thicker, if there has not been a regular period.  TREATMENT   Because there is no cure for PCOS, it needs to be managed to prevent problems. Treatments are based on your symptoms. Treatment is also based on whether you want to have a baby or whether you need contraception.   Treatment may include:  · Progesterone hormone, to start a menstrual period.  · Birth control pills, to make you have regular menstrual periods.  · Medicines to make you ovulate, if you want to get pregnant.  · Medicines to control your insulin.  · Medicine to control your blood pressure.  · Medicine and diet, to control your high cholesterol and triglycerides in your blood.  · Surgery, making small holes in the ovary, to decrease the amount of female hormone production. This is done through a long, lighted tube (laparoscope), placed into the pelvis through a tiny incision in the lower abdomen.  Your caregiver will go over some of the choices with you.  WOMEN WITH PCOS HAVE THESE CHARACTERISTICS:  · High levels of female hormones called androgens.  · An irregular or no menstrual cycle.  · May have many small cysts in their ovaries.  PCOS is the most common hormonal reproductive problem in women of childbearing age.  WHY DO WOMEN WITH PCOS HAVE TROUBLE WITH THEIR MENSTRUAL CYCLE?  Each month, about 20 eggs start to mature in the ovaries. As one egg grows and matures, the follicle breaks open to release the egg, so it can travel through the fallopian tube for  fertilization. When the single egg leaves the follicle, ovulation takes place. In women with PCOS, the ovary does not make all of the hormones it needs for any of the eggs to fully mature. They may start to grow and accumulate fluid, but no one egg becomes large enough. Instead, some may remain as cysts. Since no egg matures or is released, ovulation does not occur and the hormone progesterone is not made. Without progesterone, a woman's menstrual cycle is irregular or absent. Also, the cysts produce female hormones, which continue to prevent ovulation.   Document Released: 12/11/2004 Document Revised: 11/09/2011 Document Reviewed: 07/05/2009  ExitCare® Patient Information ©2014 [employer].

## 2013-02-08 NOTE — Progress Notes (Signed)
Subjective:     Alejandra Figueroa is a 27 y.o. female who presents to the clinic 3 weeks status post diagnostic hysteroscopy for abnormal uterine bleeding. Eating a regular diet with difficulty. Bowel movements are normal. Pain is controlled without any medications.  The following portions of the patient's history were reviewed and updated as appropriate: allergies, current medications, past family history, past medical history, past social history, past surgical history and problem list.  Review of Systems Pertinent items are noted in HPI.    Objective:    BP 139/96  Pulse 112  Temp(Src) 98.8 F (37.1 C)  Wt 306 lb (138.801 kg)  BMI 49.41 kg/m2  LMP 02/01/2013 General:  alert           Assessment:    Doing well postoperatively. Operative findings again reviewed. Pathology report discussed.  Oligo-ovulation, PCOS   Plan:    1. Continue any current medications. 2. Activity restrictions: no intercourse 3. Follow up prn 4. Start ovulation induction w/Femara

## 2013-02-09 ENCOUNTER — Encounter: Payer: Self-pay | Admitting: Obstetrics & Gynecology

## 2013-02-09 LAB — HIV ANTIBODY (ROUTINE TESTING W REFLEX): HIV: NONREACTIVE

## 2013-02-10 LAB — HEMOGLOBINOPATHY EVALUATION
Hemoglobin Other: 0 %
Hgb S Quant: 37.9 % — ABNORMAL HIGH

## 2013-02-13 ENCOUNTER — Encounter: Payer: Self-pay | Admitting: Obstetrics & Gynecology

## 2013-02-13 DIAGNOSIS — D573 Sickle-cell trait: Secondary | ICD-10-CM | POA: Insufficient documentation

## 2013-02-13 LAB — GC/CHLAMYDIA PROBE AMP, GENITAL

## 2013-04-19 ENCOUNTER — Telehealth: Payer: Self-pay | Admitting: *Deleted

## 2013-04-19 NOTE — Telephone Encounter (Signed)
Error

## 2013-04-25 ENCOUNTER — Encounter: Payer: Self-pay | Admitting: Obstetrics & Gynecology

## 2013-05-02 ENCOUNTER — Encounter: Payer: Self-pay | Admitting: Obstetrics & Gynecology

## 2013-05-15 ENCOUNTER — Encounter: Payer: Self-pay | Admitting: Obstetrics & Gynecology

## 2013-06-13 ENCOUNTER — Other Ambulatory Visit: Payer: No Typology Code available for payment source

## 2013-11-06 ENCOUNTER — Other Ambulatory Visit: Payer: Self-pay | Admitting: Obstetrics & Gynecology

## 2013-11-07 ENCOUNTER — Encounter: Payer: Self-pay | Admitting: Obstetrics & Gynecology

## 2014-01-13 ENCOUNTER — Other Ambulatory Visit: Payer: Self-pay | Admitting: Obstetrics & Gynecology

## 2014-05-26 IMAGING — US US TRANSVAGINAL NON-OB
1 series · 13 of 25 positions shown · non-contrast
Comparison: 02/23/2008

CLINICAL DATA: Irregular uterine bleeding.  Fibroids.  Infertility.
LMP 12/16/2012.



[Series 1: us pelvis complete · 13 of 61 slices shown]
[im 1/61]
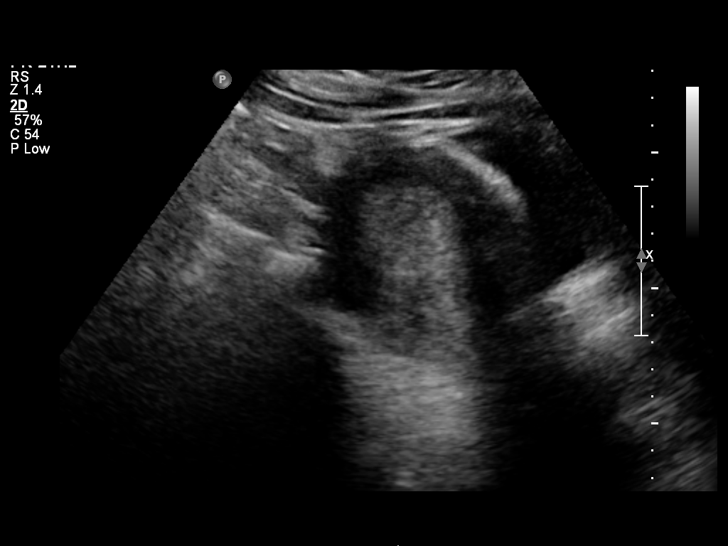
[im 6/61]
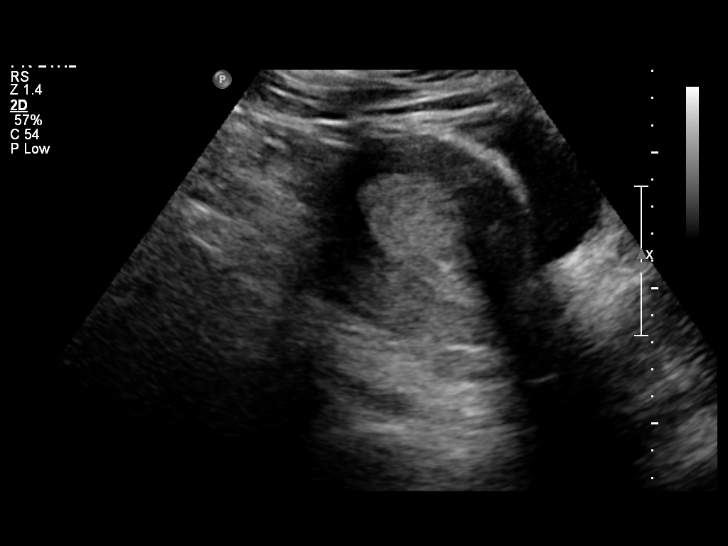
[im 11/61]
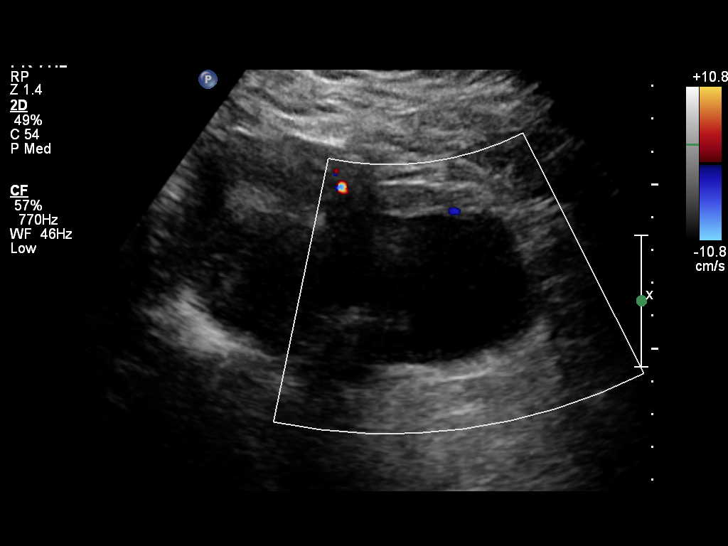
[im 16/61]
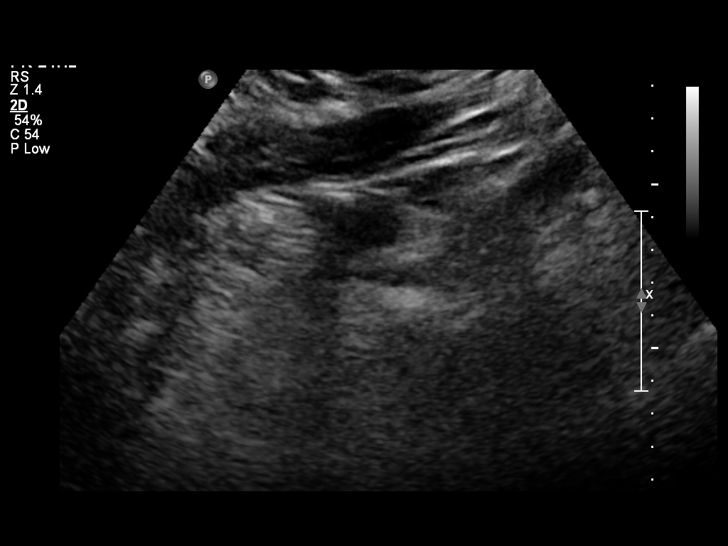
[im 21/61]
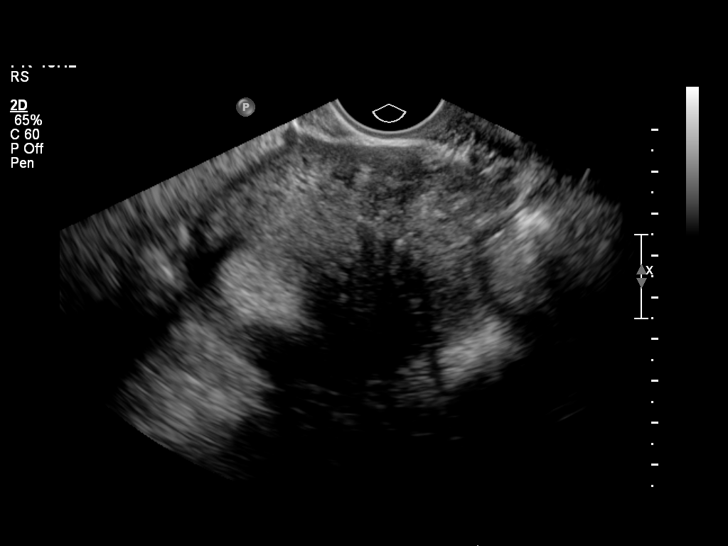
[im 26/61]
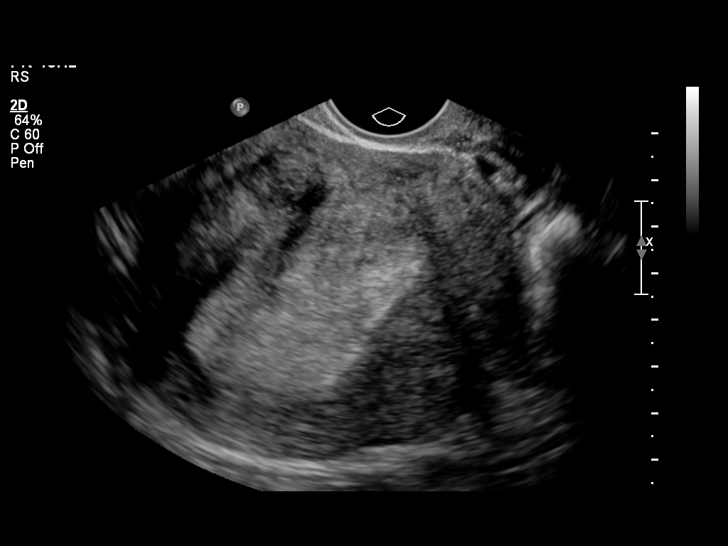
[im 31/61]
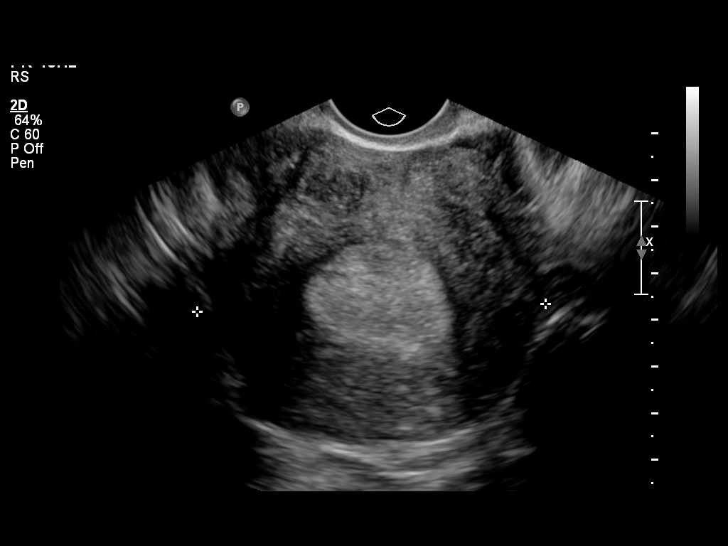
[im 36/61]
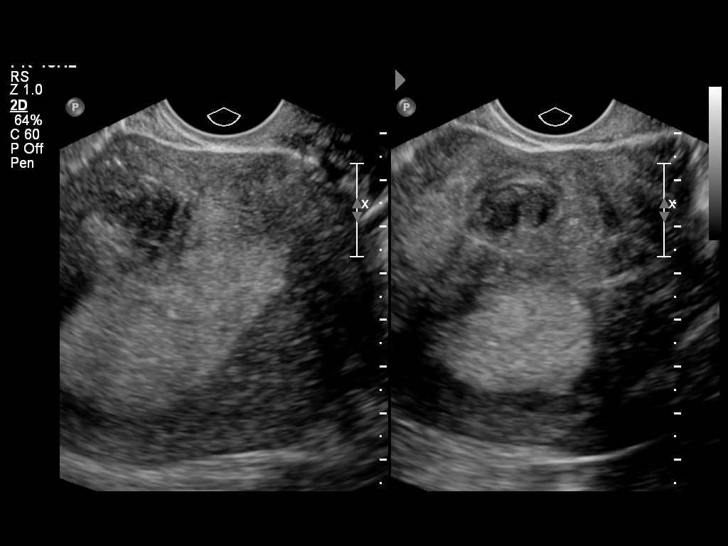
[im 41/61]
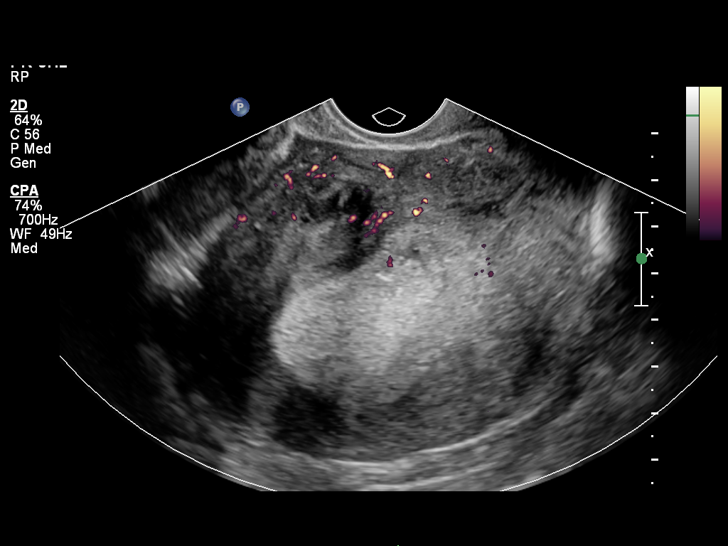
[im 46/61]
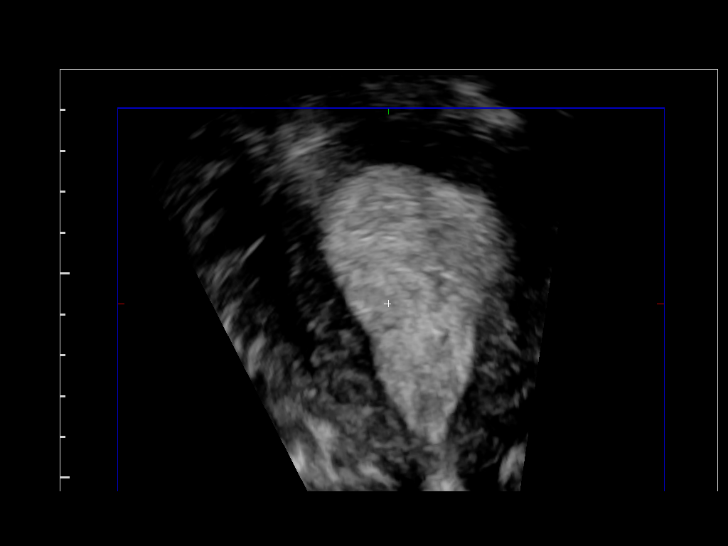
[im 51/61]
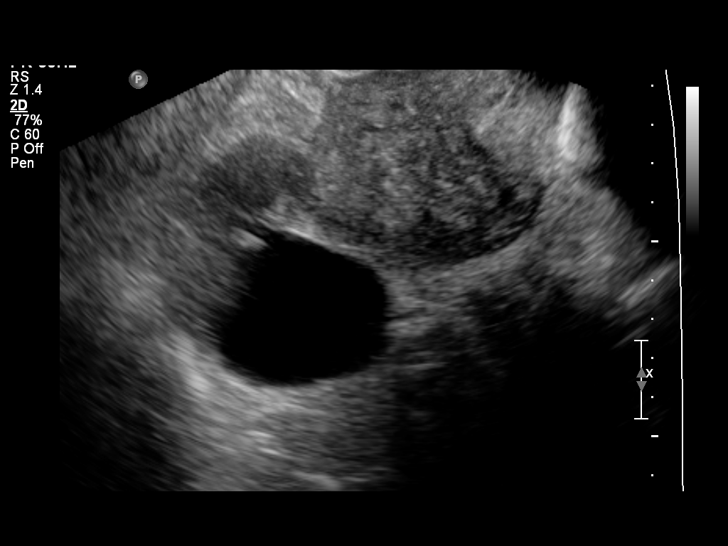
[im 56/61]
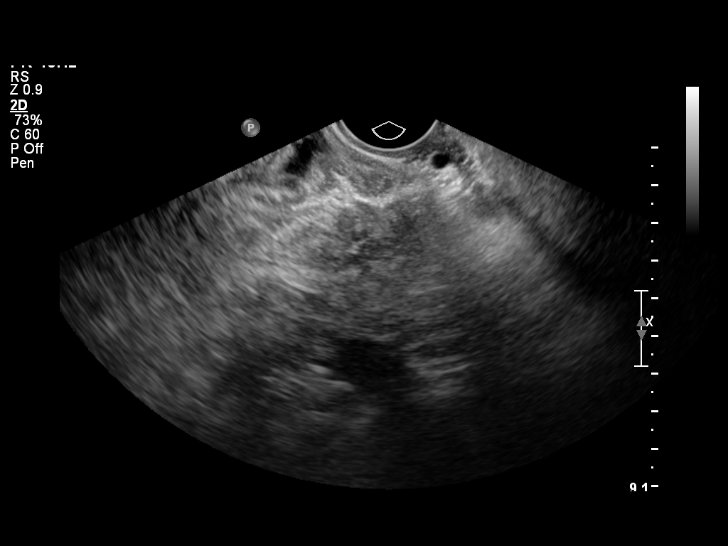
[im 61/61]
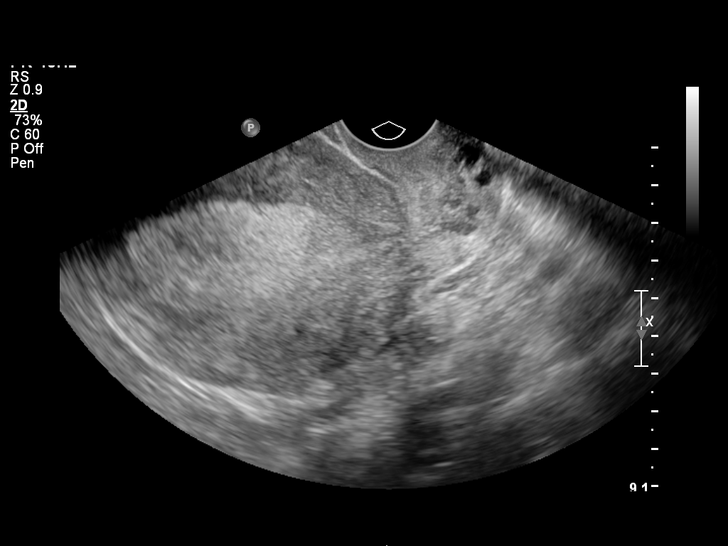

[13 of 25 positions shown; findings below may reference images not displayed]

FINDINGS: Technically difficult exam due to large patient habitus.

Uterus:  11.2 x 7.7 x 7.8 cm.  Two fibroids are seen in the
anterior corpus which measure 2.2 cm and 1.9 cm in maximum
diameter.

Endometrium: The endometrium shows diffuse thickening measuring 33
mm transvaginally.  No focal lesion is visualized.

Right ovary: Surgically absent.  No adnexal mass identified.

Left ovary: 5.8 x 5.5 by 5.3 cm.  A simple appearing cyst is seen
which measures 5.0 x 4.0 x 4.5 cm and has benign characteristics.

Other Findings:  No free fluid
IMPRESSION: 1. Diffuse endometrial thickening measuring 33 mm.  If bleeding
remains unresponsive to hormonal or medical therapy, focal lesion
work-up with sonohysterogram should be considered.  Endometrial
biopsy should also be considered in pre-menopausal patients at high
risk for endometrial carcinoma. (Ref:  Radiological Reasoning:
Algorithmic Workup of Abnormal Vaginal Bleeding with Endovaginal
Sonography and Sonohysterography. AJR [id number]; 191:S68-73)
2.  Small uterine fibroids, largest measuring 2.2 cm.
3.  5.0 cm simple left ovarian or adnexal cyst with benign
characteristics.  Yearly follow-up by ultrasound is recommended to
confirm stability in a reproductive age female.  This
recommendation follows the consensus statement:  Management of
Asymptomatic Ovarian and Other Adnexal Cysts Imaged at US:  Society
of Radiologists in Ultrasound Consensus Conference Statement.

## 2014-08-27 ENCOUNTER — Encounter: Payer: Self-pay | Admitting: *Deleted

## 2014-08-28 ENCOUNTER — Encounter: Payer: Self-pay | Admitting: Obstetrics & Gynecology

## 2014-10-21 ENCOUNTER — Ambulatory Visit (INDEPENDENT_AMBULATORY_CARE_PROVIDER_SITE_OTHER): Payer: Managed Care, Other (non HMO) | Admitting: Family Medicine

## 2014-10-21 VITALS — BP 114/74 | HR 84 | Temp 98.3°F | Resp 16 | Ht 67.0 in | Wt 292.0 lb

## 2014-10-21 DIAGNOSIS — K149 Disease of tongue, unspecified: Secondary | ICD-10-CM

## 2014-10-21 MED ORDER — NYSTATIN 100000 UNIT/ML MT SUSP: 5.0000 mL | Freq: Four times a day (QID) | OROMUCOSAL | Status: DC

## 2014-10-21 NOTE — Progress Notes (Signed)
Subjective:   This chart was scribed for Alejandra Honour, MD by Alejandra Figueroa, [facility] Scribe. This patient was seen in room 14 and the patient's care was started 3:29 PM.    Patient ID: Alejandra Figueroa, female    DOB: 01/12/1986, 29 y.o.   MRN: [id number]  10/21/2014  Mouth Irritation   HPI  HPI Comments: Alejandra Figueroa is a 29 y.o. female with a PMHx of breast cancer, anxiety, and seasonal allergies who presents to [facility] complaining of constant, moderate irritation to both sides of tongue x 2 days. Sensation described as burning. Pt states she is unable to eat due to discomfort. No white film to mouth or tongue. Pt also reports soreness to her throat. Alejandra Figueroa denies any recent new foods. However, she admits to eating cinnamon gum for the first time in several years. She has tried rinsing with salt water which she states worsens discomfort. She denies any fever or chills. No recent antibiotic use. Pt with known allergy to Morphine.  Denies tongue swelling.  Review of Systems  Constitutional: Negative for fever, chills, diaphoresis and fatigue.  HENT: Positive for sore throat. Negative for congestion, ear pain, facial swelling, mouth sores and rhinorrhea.        Irritation to sides of tongue bilaterally  Respiratory: Negative for cough and shortness of breath.   Gastrointestinal: Negative for nausea and vomiting.  Skin: Negative for rash.  Neurological: Negative for dizziness.    Past Medical History  Diagnosis Date  . PONV (postoperative nausea and vomiting)   . Seasonal allergies   . Cancer 2006    breast  . Anemia   . Allergy   . Anxiety    Past Surgical History  Procedure Laterality Date  . Ovary surgery    . Cystectomy    . Breast reconstruction  2004    cancer  . Wisdom tooth extraction    . Hysteroscopy w/d&c N/A 01/18/2013    Procedure: DILATATION AND CURETTAGE /HYSTEROSCOPY, diagnostic;  Surgeon: Lahoma Crocker, MD;  Location: [facility];  Service: Gynecology;  Laterality: N/A;  diagnostic  . Cosmetic surgery     Allergies  Allergen Reactions  . Morphine And Related Itching and Nausea And Vomiting  . Nystatin Swelling  . Peanut-Containing Drug Products     Throat swelling and itching. Gets really hot   Current Outpatient Prescriptions  Medication Sig Dispense Refill  . ibuprofen (ADVIL,MOTRIN) 200 MG tablet Take 600 mg by mouth every 6 (six) hours as needed for pain.     Marland Kitchen EPINEPHrine 0.3 mg/0.3 mL IJ SOAJ injection Inject 0.3 mLs (0.3 mg total) into the muscle once. 2 Device 1  . methylPREDNIsolone (MEDROL DOSPACK) 4 MG tablet follow package directions 21 tablet 0   No current facility-administered medications for this visit.       Objective:    BP 114/74 mmHg  Pulse 84  Temp(Src) 98.3 F (36.8 C) (Oral)  Resp 16  Ht 5\' 7"  (1.702 m)  Wt 292 lb (132.45 kg)  BMI 45.72 kg/m2  SpO2 98% Physical Exam  Constitutional: She is oriented to person, place, and time. She appears well-developed and well-nourished. No distress.  HENT:  Head: Normocephalic and atraumatic.  Right Ear: External ear normal.  Left Ear: External ear normal.  Nose: Nose normal.  Mouth/Throat: No oropharyngeal exudate.  Faint white film diffusely on tongue No buccal mucosa involvement No tongue swelling.  No lip swelling.  Eyes: Conjunctivae and EOM are normal. Pupils are equal, round, and reactive to light.  Neck: Normal range of motion. Neck supple. No thyromegaly present.  Cardiovascular: Normal rate, regular rhythm and normal heart sounds.   No murmur heard. Pulmonary/Chest: Effort normal and breath sounds normal. No respiratory distress. She has no wheezes. She has no rales.  Abdominal: She exhibits no distension.  Musculoskeletal: Normal range of motion.  Lymphadenopathy:    She has no cervical adenopathy.  Neurological: She is alert and oriented to person, place, and time.  Skin: Skin is warm  and dry. No rash noted. She is not diaphoretic. No erythema.  Psychiatric: She has a normal mood and affect. Her behavior is normal.  Nursing note and vitals reviewed.       Assessment & Plan:   1. Tongue irritation    -New. -Ddx includes contact dermatitis from recent cinnamon gum versus early thrush.  -Stop all gum products and any other sour and spicy candies/gums. -Rx for Nystatin provided to use qid for seven days.  -RTC for acute worsening.   Meds ordered this encounter  Medications  . DISCONTD: nystatin (MYCOSTATIN) 100000 UNIT/ML suspension    Sig: Take 5 mLs (500,000 Units total) by mouth 4 (four) times daily.    Dispense:  200 mL    Refill:  0    No Follow-up on file.    I personally performed the services described in this documentation, which was scribed in my presence. The recorded information has been reviewed and considered.  Kristi Elayne Guerin, M.D. [facility] [address] [place], [place]  [address] [phone] phone [phone] fax

## 2014-10-21 NOTE — Patient Instructions (Signed)
1. Avoid spicy foods (tomato based). 2.  Avoid acidic foods such as orange juice or grapefruit juice.

## 2014-10-22 ENCOUNTER — Ambulatory Visit (INDEPENDENT_AMBULATORY_CARE_PROVIDER_SITE_OTHER): Payer: Managed Care, Other (non HMO) | Admitting: Family Medicine

## 2014-10-22 VITALS — BP 132/80 | HR 80 | Temp 98.4°F | Resp 17 | Ht 67.0 in | Wt 295.0 lb

## 2014-10-22 DIAGNOSIS — T7840XA Allergy, unspecified, initial encounter: Secondary | ICD-10-CM

## 2014-10-22 DIAGNOSIS — Z91010 Allergy to peanuts: Secondary | ICD-10-CM

## 2014-10-22 DIAGNOSIS — R220 Localized swelling, mass and lump, head: Secondary | ICD-10-CM

## 2014-10-22 DIAGNOSIS — K130 Diseases of lips: Secondary | ICD-10-CM

## 2014-10-22 MED ORDER — DIPHENHYDRAMINE HCL 50 MG/ML IJ SOLN
25.0000 mg | Freq: Once | INTRAMUSCULAR | Status: AC
Start: 2014-10-22 — End: 2014-10-22
  Administered 2014-10-22: 25 mg via INTRAMUSCULAR

## 2014-10-22 MED ORDER — METHYLPREDNISOLONE (PAK) 4 MG PO TABS: ORAL_TABLET | ORAL | Status: DC

## 2014-10-22 MED ORDER — EPINEPHRINE 0.3 MG/0.3ML IJ SOAJ: 0.3000 mg | Freq: Once | INTRAMUSCULAR | Status: DC

## 2014-10-22 NOTE — Patient Instructions (Signed)
Methylprednisolone tablets What is this medicine? METHYLPREDNISOLONE (meth ill pred NISS oh lone) is a corticosteroid. It is commonly used to treat inflammation of the skin, joints, lungs, and other organs. Common conditions treated include asthma, allergies, and arthritis. It is also used for other conditions, such as blood disorders and diseases of the adrenal glands. This medicine may be used for other purposes; ask your health care provider or pharmacist if you have questions. COMMON BRAND NAME(S): Medrol, Medrol Dosepak What should I tell my health care provider before I take this medicine? They need to know if you have any of these conditions: -Cushing's syndrome -diabetes -glaucoma -heart problems or disease -high blood pressure -infection such as herpes, measles, tuberculosis, or chickenpox -kidney disease -liver disease -mental problems -myasthenia gravis -osteoporosis -seizures -stomach ulcer or intestine disease including colitis and diverticulitis -thyroid problem -an unusual or allergic reaction to lactose, methylprednisolone, other medicines, foods, dyes, or preservatives -pregnant or trying to get pregnant -breast-feeding How should I use this medicine? Take this medicine by mouth with a drink of water. Follow the directions on the prescription label. Take it with food or milk to avoid stomach upset. If you are taking this medicine once a day, take it in the morning. Do not take more medicine than you are told to take. Do not suddenly stop taking your medicine because you may develop a severe reaction. Your doctor will tell you how much medicine to take. If your doctor wants you to stop the medicine, the dose may be slowly lowered over time to avoid any side effects. Talk to your pediatrician regarding the use of this medicine in children. Special care may be needed. Overdosage: If you think you have taken too much of this medicine contact a poison control center or  emergency room at once. NOTE: This medicine is only for you. Do not share this medicine with others. What if I miss a dose? If you miss a dose, take it as soon as you can. If it is almost time for your next dose, talk to your doctor or health care professional. You may need to miss a dose or take an extra dose. Do not take double or extra doses without advice. What may interact with this medicine? Do not take this medicine with any of the following medications: -mifepristone This medicine may also interact with the following medications: -tacrolimus -vaccines -warfarin This list may not describe all possible interactions. Give your health care provider a list of all the medicines, herbs, non-prescription drugs, or dietary supplements you use. Also tell them if you smoke, drink alcohol, or use illegal drugs. Some items may interact with your medicine. What should I watch for while using this medicine? Visit your doctor or health care professional for regular checks on your progress. If you are taking this medicine for a long time, carry an identification card with your name and address, the type and dose of your medicine, and your doctor's name and address. The medicine may increase your risk of getting an infection. Stay away from people who are sick. Tell your doctor or health care professional if you are around anyone with measles or chickenpox. If you are going to have surgery, tell your doctor or health care professional that you have taken this medicine within the last twelve months. Ask your doctor or health care professional about your diet. You may need to lower the amount of salt you eat. The medicine can increase your blood sugar. If you are a  diabetic check with your doctor if you need help adjusting the dose of your diabetic medicine. What side effects may I notice from receiving this medicine? Side effects that you should report to your doctor or health care professional as soon as  possible: -allergic reactions like skin rash, itching or hives, swelling of the face, lips, or tongue -eye pain, decreased or blurred vision, or bulging eyes -fever, sore throat, sneezing, cough, or other signs of infection, wounds that will not heal -increased thirst -mental depression, mood swings, mistaken feelings of self importance or of being mistreated -pain in hips, back, ribs, arms, shoulders, or legs -swelling of the ankles, feet, hands -trouble passing urine or change in the amount of urine Side effects that usually do not require medical attention (report to your doctor or health care professional if they continue or are bothersome): -confusion, excitement, restlessness -headache -nausea, vomiting -skin problems, acne, thin and shiny skin -weight gain This list may not describe all possible side effects. Call your doctor for medical advice about side effects. You may report side effects to FDA at 1-800-FDA-1088. Where should I keep my medicine? Keep out of the reach of children. Store at room temperature between 20 and 25 degrees C (68 and 77 degrees F). Throw away any unused medicine after the expiration date. NOTE: This sheet is a summary. It may not cover all possible information. If you have questions about this medicine, talk to your doctor, pharmacist, or health care provider.  2015, Elsevier/Gold Standard. (2012-05-17 11:38:34)

## 2014-10-22 NOTE — Progress Notes (Signed)
 Chief Complaint:  Chief Complaint  Patient presents with  . Allergic Reaction    HPI: Alejandra Figueroa is a 29 y.o. female who is here for a 1 day history of  Lip swelling after using nystatin suspension She was scared and felt she was SOB but is not SOB or havaing voice changes currently, she has been fien all morning She has a peanut allergy and was here for tongue discomfort after chewing cinnamon gum , she tried the nystatin suspension x 1 and work up with the lip swelling No prior hx of angioedema  OV from 10/21/14 HPI Comments: Alejandra Figueroa is a 29 y.o. female with a PMHx of breast cancer, anxiety, and seasonal allergies who presents to [facility] complaining of constant, moderate irritation to both sides of tongue x 2 days. Sensation described as burning. Pt states she is unable to eat due to discomfort. No white film to mouth or tongue. Pt also reports soreness to her throat. Mrs. Suh denies any recent new foods. However, she admits to eating cinnamon gum for the first time in several years. She has tried rinsing with salt water which she states worsens discomfort. She denies any fever or chills. No recent antibiotic use. Pt with known allergy to Morphine.   Past Medical History  Diagnosis Date  . PONV (postoperative nausea and vomiting)   . Seasonal allergies   . Cancer 2006    breast  . Anemia   . Allergy   . Anxiety    Past Surgical History  Procedure Laterality Date  . Ovary surgery    . Cystectomy    . Breast reconstruction  2004    cancer  . Wisdom tooth extraction    . Hysteroscopy w/d&c N/A 01/18/2013    Procedure: DILATATION AND CURETTAGE /HYSTEROSCOPY, diagnostic;  Surgeon: Lahoma Crocker, MD;  Location: [facility];  Service: Gynecology;  Laterality: N/A;  diagnostic  . Cosmetic surgery     History   Social History  . Marital Status: Married    Spouse Name: Mirant  . Number of Children: N/A  . Years of  Education: N/A   Occupational History  . Unemployed    Social History Main Topics  . Smoking status: Never Smoker   . Smokeless tobacco: Never Used  . Alcohol Use: No  . Drug Use: No  . Sexual Activity:    Partners: Male    Birth Control/ Protection: Pill     Comment: not since surgery   Other Topics Concern  . None   Social History Narrative   Family History  Problem Relation Age of Onset  . Cancer Father     prostate  . Cancer Maternal Grandmother    Allergies  Allergen Reactions  . Morphine And Related Itching and Nausea And Vomiting  . Nystatin Swelling  . Peanut-Containing Drug Products     Throat swelling and itching. Gets really hot   Prior to Admission medications   Medication Sig Start Date End Date Taking? Authorizing Provider  Alum & Mag Hydroxide-Simeth (MAGIC MOUTHWASH W/LIDOCAINE) SOLN Take 5 mLs by mouth.   Yes Historical Provider, MD  ibuprofen (ADVIL,MOTRIN) 200 MG tablet Take 600 mg by mouth every 6 (six) hours as needed for pain.     Historical Provider, MD  nystatin (MYCOSTATIN) 100000 UNIT/ML suspension Take 5 mLs (500,000 Units total) by mouth 4 (four) times daily. Patient not taking: Reported on 10/22/2014 10/21/14   Renette Butters  Tamala Julian, MD     ROS: The patient denies fevers, chills, night sweats, unintentional weight loss, chest pain, palpitations, wheezing, dyspnea on exertion, nausea, vomiting, abdominal pain, dysuria, hematuria, melena, numbness, weakness, or tingling.   All other systems have been reviewed and were otherwise negative with the exception of those mentioned in the HPI and as above.    PHYSICAL EXAM: Filed Vitals:   10/22/14 0917  BP: 132/80  Pulse: 80  Temp: 98.4 F (36.9 C)  Resp: 17   Filed Vitals:   10/22/14 0917  Height: 5\' 7"  (1.702 m)  Weight: 295 lb (133.811 kg)   Body mass index is 46.19 kg/(m^2).  General: Alert, no acute distress HEENT:  Normocephalic, atraumatic, oropharynx patent. EOMI, PERRLA, + irritation  of tongue papillae, no thrush seen Cardiovascular:  Regular rate and rhythm, no rubs murmurs or gallops.  No Carotid bruits, radial pulse intact. No pedal edema.  Respiratory: Clear to auscultation bilaterally.  No wheezes, rales, or rhonchi.  No cyanosis, no use of accessory musculature GI: No organomegaly, abdomen is soft and non-tender, positive bowel sounds.  No masses. Skin: No rashes. Neurologic: Facial musculature symmetric. Psychiatric: Patient is appropriate throughout our interaction. Lymphatic: + submandibular lymphadenopathy?  Musculoskeletal: Gait intact.   LABS: Results for orders placed or performed in visit on 02/08/13  HIV antibody  Result Value Ref Range   HIV NON REACTIVE NON REACTIVE  GC/chlamydia probe amp, genital  Result Value Ref Range   Chlamydia, DNA Probe     GC Probe Amp, Genital TEST NOT PERFORMED   RPR  Result Value Ref Range   RPR Ser Ql NON REAC NON REAC  Rubella screen  Result Value Ref Range   Rubella 14.20 (H) <0.90 Index  Hemoglobinopathy evaluation  Result Value Ref Range   Hgb A 59.3 (L) 96.8 - 97.8 %   Hgb A2 Quant 2.8 2.2 - 3.2 %   Hgb F Quant 0.0 0.0 - 2.0 %   Hgb S Quant 37.9 (H) 0.0 %   Hemoglobin Other 0.0 0.0 %     EKG/XRAY:   Primary read interpreted by Dr. Marin Comment at [facility]   ASSESSMENT/PLAN: Encounter Diagnoses  Name Primary?  . Allergic reaction, initial encounter Yes  . Lip swelling   . Peanut allergy    Precautions given to go to ER PRN  Stop nystatin suspension Given Benadryl 25 mg IM x 1 Take benadryl 25 mg again when she gets home, then every 6-8 hrs until resolved Rx Medrol dose pack Rx Epi pen F/u  Prn, if her swelling in lower chin does not resolve with meds then need to f/u   Gross sideeffects, risk and benefits, and alternatives of medications d/w patient. Patient is aware that all medications have potential sideeffects and we are unable to predict every sideeffect or drug-drug interaction that may  occur.  , [place], DO 10/22/2014 10:15 AM   10/24/14-called patient, she stopped medrol dose pack, "thrush is resolving", had stopped nystatin earlier. Advise to call and follow-up if she still has issues with taste buds. I told her it would take some time. Soft foods, liquids,w arm items not hot or too cold.

## 2014-10-23 ENCOUNTER — Telehealth: Payer: Self-pay | Admitting: *Deleted

## 2014-10-23 NOTE — Telephone Encounter (Signed)
Pt called today regarding an concern of an allergic reaction to some meds she has been taking for the past couple of days.   Pt was seen on yesterday by Dr. Marin Comment regarding lip swelling.  Pt was given methylPREDNIsolone (MEDROL DOSPACK) 4 MG tablet as well as an Oral medication.  Pt believes she is having an case of yeast in her mouth due to the oral meds.  I advise pt to stop med and wait 24 hours to see if it goes away.  I also advised the pt to return if her sxs become worse or if the yeast does not go away.  Pt understood.

## 2015-01-09 ENCOUNTER — Telehealth: Payer: Self-pay | Admitting: Obstetrics

## 2015-01-14 NOTE — Telephone Encounter (Signed)
[id number] - Patient has not returned calls to schedule AEX. brm

## 2016-01-30 DIAGNOSIS — Z9889 Other specified postprocedural states: Secondary | ICD-10-CM | POA: Insufficient documentation

## 2016-01-30 DIAGNOSIS — T8549XA Other mechanical complication of breast prosthesis and implant, initial encounter: Secondary | ICD-10-CM | POA: Insufficient documentation

## 2016-07-21 ENCOUNTER — Emergency Department (HOSPITAL_COMMUNITY)
Admission: EM | Admit: 2016-07-21 | Discharge: 2016-07-22 | Disposition: A | Discharge status: Home / Self Care | Payer: Managed Care, Other (non HMO) | Attending: Emergency Medicine | Admitting: Emergency Medicine

## 2016-07-21 ENCOUNTER — Encounter (HOSPITAL_COMMUNITY): Payer: Self-pay | Admitting: *Deleted

## 2016-07-21 DIAGNOSIS — Y829 Unspecified medical devices associated with adverse incidents: Secondary | ICD-10-CM | POA: Insufficient documentation

## 2016-07-21 DIAGNOSIS — T8189XA Other complications of procedures, not elsewhere classified, initial encounter: Secondary | ICD-10-CM | POA: Insufficient documentation

## 2016-07-21 DIAGNOSIS — Z4801 Encounter for change or removal of surgical wound dressing: Secondary | ICD-10-CM | POA: Diagnosis present

## 2016-07-21 DIAGNOSIS — Z91010 Allergy to peanuts: Secondary | ICD-10-CM | POA: Insufficient documentation

## 2016-07-21 DIAGNOSIS — Z5189 Encounter for other specified aftercare: Secondary | ICD-10-CM

## 2016-07-21 DIAGNOSIS — Z853 Personal history of malignant neoplasm of breast: Secondary | ICD-10-CM | POA: Diagnosis not present

## 2016-07-21 MED ORDER — SODIUM CHLORIDE 0.9 % IV BOLUS (SEPSIS)
1000.0000 mL | Freq: Once | INTRAVENOUS | Status: AC
  Administered 2016-07-22: 1000 mL via INTRAVENOUS

## 2016-07-21 NOTE — ED Notes (Signed)
ED Provider at bedside. 

## 2016-07-21 NOTE — ED Notes (Signed)
Pt discussed with dr Roxanne Mins  Chest xray requested

## 2016-07-21 NOTE — ED Provider Notes (Signed)
[facility] DEPT Provider Note   CSN: [id number] Arrival date & time: 07/21/16  2131 By signing my name below, I, Alejandra Figueroa, attest that this documentation has been prepared under the direction and in the presence of Alejandra Essex, MD. Electronically Signed: Doran Figueroa, ED Scribe. 07/21/16. 11:17 PM.  History   Chief Complaint Chief Complaint  Patient presents with  . Wound Check   The history is provided by the patient. No language interpreter was used.   HPI Comments: Alejandra Figueroa is a 30 y.o. female who presents to the Emergency Department with a PMHx of left mastectomy complaining of drainage from breast incision made today. Pt states she had a left breast expander replaced last Wednesday, 6 days ago at [facility]. Today, she had a post-op appointment where the surgeon injected 200cc of saline into her expander. However, later in the day, as she was showering she noted drainage from the incision site where the saline was injected. The drainage was clear and brown. This has happened to her before, in late October 2017 where her expander ruptured. She denies any blood drainage, breast pain, fevers, N/V/D, SOB or any other symoptoms at this time.   Pt had her first left breast expander in July 2017.   Past Medical History:  Diagnosis Date  . Allergy   . Anemia   . Anxiety   . Cancer ([place]) 2006   breast  . PONV (postoperative nausea and vomiting)   . Seasonal allergies    Patient Active Problem List   Diagnosis Date Noted  . Sickle cell trait ([place]) 02/13/2013  . Polycystic ovarian syndrome 02/08/2013  . Endometrial thickening on ultra sound 12/30/2012  . Obesities, morbid ([place]) 12/09/2012  . Irregular menstrual cycle 12/09/2012  . Female infertility 12/09/2012    Past Surgical History:  Procedure Laterality Date  . BREAST RECONSTRUCTION  2004   cancer  . COSMETIC SURGERY    . CYSTECTOMY    . HYSTEROSCOPY W/D&C N/A 01/18/2013   Procedure: DILATATION AND CURETTAGE /HYSTEROSCOPY, diagnostic;  Surgeon: Lahoma Crocker, MD;  Location: [facility];  Service: Gynecology;  Laterality: N/A;  diagnostic  . OVARY SURGERY    . WISDOM TOOTH EXTRACTION      OB History    No data available       Home Medications    Prior to Admission medications   Medication Sig Start Date End Date Taking? Authorizing Provider  EPINEPHrine 0.3 mg/0.3 mL IJ SOAJ injection Inject 0.3 mLs (0.3 mg total) into the muscle once. 10/22/14   Thao P Le, DO  ibuprofen (ADVIL,MOTRIN) 200 MG tablet Take 600 mg by mouth every 6 (six) hours as needed for pain.     Historical Provider, MD  methylPREDNIsolone (MEDROL DOSPACK) 4 MG tablet follow package directions 10/22/14   Glenford Bayley, DO    Family History Family History  Problem Relation Age of Onset  . Cancer Father     prostate  . Cancer Maternal Grandmother     Social History Social History  Substance Use Topics  . Smoking status: Never Smoker  . Smokeless tobacco: Never Used  . Alcohol use No   Allergies   Morphine and related; Nystatin; and Peanut-containing drug products  Review of Systems Review of Systems A complete 10 system review of systems was obtained and all systems are negative except as noted in the HPI and PMH.   Physical Exam Updated Vital Signs BP 131/72 (BP Location: Right Arm)  Pulse 101   Temp 98 F (36.7 C)   Resp 19   Ht 5\' 6"  (1.676 m)   Wt (!) 317 lb (143.8 kg)   LMP 07/21/2016   SpO2 99%   BMI 51.17 kg/m   Physical Exam  Constitutional: She is oriented to person, place, and time. She appears well-developed and well-nourished. No distress.  HENT:  Head: Normocephalic and atraumatic.  Mouth/Throat: Oropharynx is clear and moist. No oropharyngeal exudate.  Eyes: Conjunctivae and EOM are normal. Pupils are equal, round, and reactive to light.  Neck: Normal range of motion. Neck supple.  No meningismus.  Cardiovascular: Regular rhythm, normal heart sounds  and intact distal pulses.   No murmur heard. Tachycardia around 100  Pulmonary/Chest: Effort normal and breath sounds normal. No respiratory distress.  Abdominal: Soft. There is no tenderness. There is no rebound and no guarding.  Musculoskeletal: Normal range of motion. She exhibits no edema or tenderness.  Neurological: She is alert and oriented to person, place, and time. No cranial nerve deficit. She exhibits normal muscle tone. Coordination normal.   5/5 strength throughout. CN 2-12 intact.Equal grip strength.   Skin: Skin is warm.  Healing 10 cm transverse incision mid left breast extending to axilla. Clear, brown, tinge drainage from center, approximately 4cm in. No surronding erythema and fluctuance.  Psychiatric: She has a normal mood and affect. Her behavior is normal.  Nursing note and vitals reviewed.  ED Treatments / Results  DIAGNOSTIC STUDIES: Oxygen Saturation is 100% on room air, normal by my interpretation.    COORDINATION OF CARE: 11:17 PM Discussed treatment plan with pt at bedside and pt agreed to plan.  Labs (all labs ordered are listed, but only abnormal results are displayed) Labs Reviewed  CBC WITH DIFFERENTIAL/PLATELET - Abnormal; Notable for the following:       Result Value   Hemoglobin 9.2 (*)    HCT 30.1 (*)    MCV 60.4 (*)    MCH 18.5 (*)    RDW 18.3 (*)    All other components within normal limits  COMPREHENSIVE METABOLIC PANEL - Abnormal; Notable for the following:    Glucose, Bld 116 (*)    Albumin 3.3 (*)    AST 12 (*)    ALT 9 (*)    Total Bilirubin <0.1 (*)    All other components within normal limits  I-STAT CG4 LACTIC ACID, ED   EKG  EKG Interpretation None      Radiology No results found.  Procedures Procedures (including critical care time)  Medications Ordered in ED Medications - No data to display  Initial Impression / Assessment and Plan / ED Course  I have reviewed the triage vital signs and the nursing  notes.  Pertinent labs & imaging results that were available during my care of the patient were reviewed by me and considered in my medical decision making (see chart for details).  Clinical Course    Patient with [place] Shark and clear drainage from left breast surgical incision at site of tissue expander that was recently placed. No fever, chills, nausea or vomiting.  Exam shows no erythema or fluctuance. There is clear drainage which is likely saline from expander  There is no evidence of surrounding infection. WBC normal. No fever.  D/w Dr. Eugene Garnet on call for Dr. Shanon Brow.  He agrees with discharge and patient to followup with Dr. Shanon Brow tomorrow for [place] of expander area. No evidence of infection or need for admission tonight.  Final Clinical Impressions(s) / ED Diagnoses   Final diagnoses:  Encounter for wound re-check    New Prescriptions New Prescriptions   No medications on file  I personally performed the services described in this documentation, which was scribed in my presence. The recorded information has been reviewed and is accurate.    Alejandra Essex, MD 07/22/16 [phone]

## 2016-07-21 NOTE — ED Triage Notes (Signed)
The drainage is increasing  Acuity increased  The pt does not want the xray until she sees a doctor

## 2016-07-21 NOTE — ED Triage Notes (Signed)
The pt had breast cancer in 2004 .  She had reconstructive surgery on her rt breast.  The breast tissue was removed on the lt  She has had an expander placed on the lt 2005  Last Wednesday she had a new expander placed  Today while sitting in the br the lt breast area started leaknig and is still leaknng  Brownish liquid

## 2016-07-22 LAB — CBC WITH DIFFERENTIAL/PLATELET
BASOS PCT: 0 %
Basophils Absolute: 0 10*3/uL (ref 0.0–0.1)
EOS ABS: 0.3 10*3/uL (ref 0.0–0.7)
EOS PCT: 5 %
HCT: 30.1 % — ABNORMAL LOW (ref 36.0–46.0)
HEMOGLOBIN: 9.2 g/dL — AB (ref 12.0–15.0)
LYMPHS PCT: 29 %
Lymphs Abs: 2 10*3/uL (ref 0.7–4.0)
MCH: 18.5 pg — AB (ref 26.0–34.0)
MCHC: 30.6 g/dL (ref 30.0–36.0)
MCV: 60.4 fL — AB (ref 78.0–100.0)
MONO ABS: 0.8 10*3/uL (ref 0.1–1.0)
Monocytes Relative: 12 %
NEUTROS ABS: 3.7 10*3/uL (ref 1.7–7.7)
Neutrophils Relative %: 54 %
Platelets: 352 10*3/uL (ref 150–400)
RBC: 4.98 MIL/uL (ref 3.87–5.11)
RDW: 18.3 % — ABNORMAL HIGH (ref 11.5–15.5)
WBC: 6.8 10*3/uL (ref 4.0–10.5)

## 2016-07-22 LAB — COMPREHENSIVE METABOLIC PANEL
ALK PHOS: 47 U/L (ref 38–126)
ALT: 9 U/L — ABNORMAL LOW (ref 14–54)
ANION GAP: 6 (ref 5–15)
AST: 12 U/L — ABNORMAL LOW (ref 15–41)
Albumin: 3.3 g/dL — ABNORMAL LOW (ref 3.5–5.0)
BUN: 6 mg/dL (ref 6–20)
CALCIUM: 9.1 mg/dL (ref 8.9–10.3)
CO2: 27 mmol/L (ref 22–32)
CREATININE: 0.71 mg/dL (ref 0.44–1.00)
Chloride: 103 mmol/L (ref 101–111)
Glucose, Bld: 116 mg/dL — ABNORMAL HIGH (ref 65–99)
Potassium: 4 mmol/L (ref 3.5–5.1)
SODIUM: 136 mmol/L (ref 135–145)
TOTAL PROTEIN: 7.4 g/dL (ref 6.5–8.1)

## 2016-07-22 LAB — I-STAT CG4 LACTIC ACID, ED: Lactic Acid, Venous: 1.11 mmol/L (ref 0.5–1.9)

## 2016-07-22 NOTE — Discharge Instructions (Signed)
Called Dr. Shanon Brow for a recheck of your surgical site tomorrow. Return to the ED if you develop new or worsening symptoms.

## 2016-07-22 NOTE — ED Notes (Signed)
Nurse drawing labs. 

## 2016-08-26 ENCOUNTER — Telehealth: Payer: Self-pay

## 2016-08-26 NOTE — Telephone Encounter (Signed)
Returned call, patient stated that she wanted to schedule an appt for irregular bleeding, transferred to scheduler.

## 2016-09-21 ENCOUNTER — Ambulatory Visit: Payer: Managed Care, Other (non HMO) | Admitting: Obstetrics & Gynecology

## 2016-11-19 DIAGNOSIS — D509 Iron deficiency anemia, unspecified: Secondary | ICD-10-CM | POA: Insufficient documentation

## 2017-04-10 ENCOUNTER — Emergency Department (HOSPITAL_BASED_OUTPATIENT_CLINIC_OR_DEPARTMENT_OTHER)
Admission: EM | Admit: 2017-04-10 | Discharge: 2017-04-11 | Disposition: A | Discharge status: Home / Self Care | Payer: 59 | Attending: Emergency Medicine | Admitting: Emergency Medicine

## 2017-04-10 ENCOUNTER — Encounter (HOSPITAL_BASED_OUTPATIENT_CLINIC_OR_DEPARTMENT_OTHER): Payer: Self-pay | Admitting: Emergency Medicine

## 2017-04-10 DIAGNOSIS — Z91010 Allergy to peanuts: Secondary | ICD-10-CM | POA: Diagnosis not present

## 2017-04-10 DIAGNOSIS — N939 Abnormal uterine and vaginal bleeding, unspecified: Secondary | ICD-10-CM | POA: Diagnosis present

## 2017-04-10 DIAGNOSIS — N938 Other specified abnormal uterine and vaginal bleeding: Secondary | ICD-10-CM | POA: Insufficient documentation

## 2017-04-10 LAB — PREGNANCY, URINE: PREG TEST UR: NEGATIVE

## 2017-04-10 NOTE — ED Triage Notes (Signed)
Patient states that she has had vaginal bleeding and abdominal cramping for the last week - 2 weeks. Repots that she is unable to get with her OB dr until next month. PAtient denies being lightheaded or dizzy today. Patient reports that she has had a fibroid int he past and wants to be checked for that

## 2017-04-11 LAB — CBC
HEMATOCRIT: 31.1 % — AB (ref 36.0–46.0)
HEMOGLOBIN: 10.3 g/dL — AB (ref 12.0–15.0)
MCH: 25.4 pg — ABNORMAL LOW (ref 26.0–34.0)
MCHC: 33.1 g/dL (ref 30.0–36.0)
MCV: 76.6 fL — AB (ref 78.0–100.0)
Platelets: 306 10*3/uL (ref 150–400)
RBC: 4.06 MIL/uL (ref 3.87–5.11)
RDW: 14.6 % (ref 11.5–15.5)
WBC: 6.4 10*3/uL (ref 4.0–10.5)

## 2017-04-11 NOTE — ED Provider Notes (Signed)
[facility] DEPT [facility] Provider Note   CSN: [id number] Arrival date & time: 04/10/17  2218     History   Chief Complaint Chief Complaint  Patient presents with  . Vaginal Bleeding    HPI Alejandra Figueroa is a 31 y.o. female.  HPI Patient reports vaginal bleeding and abdominal cramping of the past 2 weeks.  She has long-standing history of dysfunctional uterine bleeding and history of uterine fibroids.  She is scheduled to see her gynecologist on September 21 but is concerned that this is too long and feels as though she would benefit from an ultrasound.  She denies fevers and chills.  Her pain is located in her lower abdomen is mild in severity.  Has been constant.  No lightheadedness.  No dizziness.  No history of blood transfusion.   Past Medical History:  Diagnosis Date  . Allergy   . Anemia   . Anxiety   . Cancer ([place]) 2006   breast  . PONV (postoperative nausea and vomiting)   . Seasonal allergies     Patient Active Problem List   Diagnosis Date Noted  . Sickle cell trait ([place]) 02/13/2013  . Polycystic ovarian syndrome 02/08/2013  . Endometrial thickening on ultra sound 12/30/2012  . Obesities, morbid ([place]) 12/09/2012  . Irregular menstrual cycle 12/09/2012  . Female infertility 12/09/2012    Past Surgical History:  Procedure Laterality Date  . BREAST RECONSTRUCTION  2004   cancer  . COSMETIC SURGERY    . CYSTECTOMY    . HYSTEROSCOPY W/D&C N/A 01/18/2013   Procedure: DILATATION AND CURETTAGE /HYSTEROSCOPY, diagnostic;  Surgeon: Lahoma Crocker, MD;  Location: [facility];  Service: Gynecology;  Laterality: N/A;  diagnostic  . OVARY SURGERY    . WISDOM TOOTH EXTRACTION      OB History    No data available       Home Medications    Prior to Admission medications   Medication Sig Start Date End Date Taking? Authorizing Provider  diazepam (VALIUM) 5 MG tablet Take 5 mg by mouth every 6 (six) hours. 07/15/16   [provider]  EPINEPHrine  0.3 mg/0.3 mL IJ SOAJ injection Inject 0.3 mLs (0.3 mg total) into the muscle once. Patient taking differently: Inject 0.3 mg into the muscle as needed (for allergic reaction).  10/22/14   Le, Thao P, DO  methylPREDNIsolone (MEDROL DOSPACK) 4 MG tablet follow package directions Patient not taking: Reported on 07/22/2016 10/22/14   Le, Thao P, DO  oxyCODONE (OXY IR/ROXICODONE) 5 MG immediate release tablet Take 5 mg by mouth every 4 (four) hours. 07/15/16   [provider]  sulfamethoxazole-trimethoprim (BACTRIM DS,SEPTRA DS) 800-160 MG tablet Take 1 tablet by mouth 2 (two) times daily. 07/15/16   [provider]    Family History Family History  Problem Relation Age of Onset  . Cancer Father        prostate  . Cancer Maternal Grandmother     Social History Social History  Substance Use Topics  . Smoking status: Never Smoker  . Smokeless tobacco: Never Used  . Alcohol use No     Allergies   Morphine and related; Nystatin; and Peanut-containing drug products   Review of Systems Review of Systems  All other systems reviewed and are negative.    Physical Exam Updated Vital Signs BP (!) 144/94 (BP Location: Right Arm)   Pulse (!) 102   Temp 98.9 F (37.2 C) (Oral)   Resp 20   Ht 5\' 6"  (  1.676 m)   Wt (!) 142.9 kg (315 lb)   SpO2 100%   BMI 50.84 kg/m   Physical Exam  Constitutional: She is oriented to person, place, and time. She appears well-developed and well-nourished.  HENT:  Head: Normocephalic.  Eyes: EOM are normal.  Neck: Normal range of motion.  Pulmonary/Chest: Effort normal.  Abdominal: She exhibits no distension. There is no tenderness.  Musculoskeletal: Normal range of motion.  Neurological: She is alert and oriented to person, place, and time.  Psychiatric: She has a normal mood and affect.  Nursing note and vitals reviewed.    ED Treatments / Results  Labs (all labs ordered are listed, but only abnormal results are  displayed) Labs Reviewed  CBC - Abnormal; Notable for the following:       Result Value   Hemoglobin 10.3 (*)    HCT 31.1 (*)    MCV 76.6 (*)    MCH 25.4 (*)    All other components within normal limits  PREGNANCY, URINE    EKG  EKG Interpretation None       Radiology No results found.  Procedures Procedures (including critical care time)  Medications Ordered in ED Medications - No data to display   Initial Impression / Assessment and Plan / ED Course  I have reviewed the triage vital signs and the nursing notes.  Pertinent labs & imaging results that were available during my care of the patient were reviewed by me and considered in my medical decision making (see chart for details).     Dysfunctional uterine bleeding. Home with ob gyn follow up. Outpatient USWill be needed.  There is no indication for urgent ultrasound tonight.  Hemoglobin stable  Final Clinical Impressions(s) / ED Diagnoses   Final diagnoses:  Vaginal bleeding    New Prescriptions New Prescriptions   No medications on file     Jola Schmidt, MD 04/11/17 [phone]

## 2017-07-20 DIAGNOSIS — N83202 Unspecified ovarian cyst, left side: Secondary | ICD-10-CM | POA: Insufficient documentation

## 2017-07-21 DIAGNOSIS — R7303 Prediabetes: Secondary | ICD-10-CM | POA: Insufficient documentation

## 2017-10-04 DIAGNOSIS — D252 Subserosal leiomyoma of uterus: Secondary | ICD-10-CM

## 2017-10-04 DIAGNOSIS — N946 Dysmenorrhea, unspecified: Secondary | ICD-10-CM | POA: Insufficient documentation

## 2017-10-04 DIAGNOSIS — D251 Intramural leiomyoma of uterus: Secondary | ICD-10-CM | POA: Insufficient documentation

## 2018-04-01 ENCOUNTER — Other Ambulatory Visit: Payer: Self-pay | Admitting: Obstetrics & Gynecology

## 2018-04-01 DIAGNOSIS — J9589 Other postprocedural complications and disorders of respiratory system, not elsewhere classified: Principal | ICD-10-CM

## 2018-04-01 DIAGNOSIS — I9789 Other postprocedural complications and disorders of the circulatory system, not elsewhere classified: Secondary | ICD-10-CM

## 2018-04-01 NOTE — Progress Notes (Signed)
S/P robotic-assisted myomectomy, paratubal cyst removal.  Persistent mild tachycardia.  Low suspicion for a possible PE.

## 2018-04-02 ENCOUNTER — Emergency Department (HOSPITAL_COMMUNITY)
Admission: EM | Admit: 2018-04-02 | Discharge: 2018-04-02 | Discharge status: Absent without leave | Payer: Managed Care, Other (non HMO)

## 2018-04-02 ENCOUNTER — Ambulatory Visit (HOSPITAL_COMMUNITY)
Admission: RE | Admit: 2018-04-02 | Discharge: 2018-04-02 | Disposition: A | Discharge status: Home / Self Care | Payer: 59 | Source: Ambulatory Visit | Attending: Obstetrics & Gynecology | Admitting: Obstetrics & Gynecology

## 2018-04-02 ENCOUNTER — Encounter (HOSPITAL_COMMUNITY): Payer: Self-pay | Admitting: Radiology

## 2018-04-02 DIAGNOSIS — I9789 Other postprocedural complications and disorders of the circulatory system, not elsewhere classified: Secondary | ICD-10-CM | POA: Insufficient documentation

## 2018-04-02 DIAGNOSIS — J9589 Other postprocedural complications and disorders of respiratory system, not elsewhere classified: Secondary | ICD-10-CM | POA: Diagnosis present

## 2018-04-02 MED ORDER — IOPAMIDOL (ISOVUE-370) INJECTION 76%
100.0000 mL | Freq: Once | INTRAVENOUS | Status: AC | PRN
  Administered 2018-04-02: 100 mL via INTRAVENOUS

## 2018-04-02 MED ORDER — IOPAMIDOL (ISOVUE-370) INJECTION 76%
INTRAVENOUS | Status: AC
  Filled 2018-04-02: qty 100

## 2018-07-12 ENCOUNTER — Other Ambulatory Visit: Payer: Self-pay | Admitting: Orthopaedic Surgery

## 2018-07-12 DIAGNOSIS — M25511 Pain in right shoulder: Secondary | ICD-10-CM

## 2018-07-21 ENCOUNTER — Ambulatory Visit
Admission: RE | Admit: 2018-07-21 | Discharge: 2018-07-21 | Disposition: A | Discharge status: Home / Self Care | Payer: 59 | Source: Ambulatory Visit | Attending: Orthopaedic Surgery | Admitting: Orthopaedic Surgery

## 2018-07-21 ENCOUNTER — Encounter: Payer: Self-pay | Admitting: Radiology

## 2018-07-21 DIAGNOSIS — M25511 Pain in right shoulder: Secondary | ICD-10-CM

## 2018-07-21 MED ORDER — IOPAMIDOL (ISOVUE-M 200) INJECTION 41%: 12.0000 mL | Freq: Once | INTRAMUSCULAR | Status: DC

## 2018-08-16 ENCOUNTER — Emergency Department (HOSPITAL_BASED_OUTPATIENT_CLINIC_OR_DEPARTMENT_OTHER)
Admission: EM | Admit: 2018-08-16 | Discharge: 2018-08-16 | Disposition: A | Discharge status: Home / Self Care | Payer: 59 | Attending: Emergency Medicine | Admitting: Emergency Medicine

## 2018-08-16 ENCOUNTER — Encounter (HOSPITAL_BASED_OUTPATIENT_CLINIC_OR_DEPARTMENT_OTHER): Payer: Self-pay | Admitting: *Deleted

## 2018-08-16 ENCOUNTER — Other Ambulatory Visit: Payer: Self-pay

## 2018-08-16 DIAGNOSIS — Z79899 Other long term (current) drug therapy: Secondary | ICD-10-CM | POA: Diagnosis not present

## 2018-08-16 DIAGNOSIS — Z853 Personal history of malignant neoplasm of breast: Secondary | ICD-10-CM | POA: Insufficient documentation

## 2018-08-16 DIAGNOSIS — M5431 Sciatica, right side: Secondary | ICD-10-CM | POA: Diagnosis not present

## 2018-08-16 DIAGNOSIS — M549 Dorsalgia, unspecified: Secondary | ICD-10-CM | POA: Diagnosis present

## 2018-08-16 DIAGNOSIS — Z91010 Allergy to peanuts: Secondary | ICD-10-CM | POA: Insufficient documentation

## 2018-08-16 MED ORDER — DICLOFENAC SODIUM 75 MG PO TBEC: 75.0000 mg | DELAYED_RELEASE_TABLET | Freq: Two times a day (BID) | ORAL | 0 refills | Status: DC

## 2018-08-16 MED ORDER — HYDROMORPHONE HCL 1 MG/ML IJ SOLN
1.0000 mg | Freq: Once | INTRAMUSCULAR | Status: AC
  Administered 2018-08-16: 1 mg via INTRAMUSCULAR
  Filled 2018-08-16: qty 1

## 2018-08-16 MED ORDER — METHOCARBAMOL 500 MG PO TABS: 500.0000 mg | ORAL_TABLET | Freq: Two times a day (BID) | ORAL | 0 refills | Status: DC

## 2018-08-16 MED ORDER — METHYLPREDNISOLONE SODIUM SUCC 125 MG IJ SOLR
125.0000 mg | Freq: Once | INTRAMUSCULAR | Status: AC
  Administered 2018-08-16: 125 mg via INTRAMUSCULAR
  Filled 2018-08-16: qty 2

## 2018-08-16 MED ORDER — KETOROLAC TROMETHAMINE 60 MG/2ML IM SOLN
60.0000 mg | Freq: Once | INTRAMUSCULAR | Status: AC
  Administered 2018-08-16: 60 mg via INTRAMUSCULAR
  Filled 2018-08-16: qty 2

## 2018-08-16 NOTE — ED Provider Notes (Signed)
[facility] EMERGENCY DEPARTMENT Provider Note   CSN: [id number] Arrival date & time: 08/16/18  1841     History   Chief Complaint Chief Complaint  Patient presents with  . Back Pain    HPI Alejandra Figueroa is a 32 y.o. female.  The history is provided by the patient. No language interpreter was used.  Back Pain   This is a new problem. The current episode started more than 2 days ago. The problem occurs constantly. The problem has been gradually worsening. The pain is associated with no known injury. The pain is present in the lumbar spine. The quality of the pain is described as aching and shooting. The pain is moderate. The pain is worse during the day. She has tried nothing for the symptoms. The treatment provided no relief.    Past Medical History:  Diagnosis Date  . Allergy   . Anemia   . Anxiety   . Cancer ([place]) 2006   breast  . PONV (postoperative nausea and vomiting)   . Seasonal allergies     Patient Active Problem List   Diagnosis Date Noted  . Sickle cell trait ([place]) 02/13/2013  . Polycystic ovarian syndrome 02/08/2013  . Endometrial thickening on ultra sound 12/30/2012  . Obesities, morbid ([place]) 12/09/2012  . Irregular menstrual cycle 12/09/2012  . Female infertility 12/09/2012    Past Surgical History:  Procedure Laterality Date  . BREAST RECONSTRUCTION  2004   cancer  . COSMETIC SURGERY    . CYSTECTOMY    . HYSTEROSCOPY W/D&C N/A 01/18/2013   Procedure: DILATATION AND CURETTAGE /HYSTEROSCOPY, diagnostic;  Surgeon: Lahoma Crocker, MD;  Location: [facility];  Service: Gynecology;  Laterality: N/A;  diagnostic  . MYOMECTOMY    . OVARY SURGERY    . WISDOM TOOTH EXTRACTION       OB History   No obstetric history on file.      Home Medications    Prior to Admission medications   Medication Sig Start Date End Date Taking? Authorizing Provider  diazepam (VALIUM) 5 MG tablet Take 5 mg by mouth every 6 (six) hours. 07/15/16    [provider]  diclofenac (VOLTAREN) 75 MG EC tablet Take 1 tablet (75 mg total) by mouth 2 (two) times daily. 08/16/18   Fransico Meadow, PA-C  EPINEPHrine 0.3 mg/0.3 mL IJ SOAJ injection Inject 0.3 mLs (0.3 mg total) into the muscle once. Patient taking differently: Inject 0.3 mg into the muscle as needed (for allergic reaction).  10/22/14   Le, Thao P, DO  methocarbamol (ROBAXIN) 500 MG tablet Take 1 tablet (500 mg total) by mouth 2 (two) times daily. 08/16/18   Fransico Meadow, PA-C  methylPREDNIsolone (MEDROL DOSPACK) 4 MG tablet follow package directions Patient not taking: Reported on 07/22/2016 10/22/14   Le, Thao P, DO  oxyCODONE (OXY IR/ROXICODONE) 5 MG immediate release tablet Take 5 mg by mouth every 4 (four) hours. 07/15/16   [provider]  sulfamethoxazole-trimethoprim (BACTRIM DS,SEPTRA DS) 800-160 MG tablet Take 1 tablet by mouth 2 (two) times daily. 07/15/16   [provider]    Family History Family History  Problem Relation Age of Onset  . Cancer Father        prostate  . Cancer Maternal Grandmother     Social History Social History   Tobacco Use  . Smoking status: Never Smoker  . Smokeless tobacco: Never Used  Substance Use Topics  . Alcohol use: No    Alcohol/week:  0.0 standard drinks  . Drug use: No     Allergies   Morphine and related; Nystatin; and Peanut-containing drug products   Review of Systems Review of Systems  Musculoskeletal: Positive for back pain.  All other systems reviewed and are negative.    Physical Exam Updated Vital Signs BP (!) 134/98 (BP Location: Right Arm)   Pulse 95   Temp 98.3 F (36.8 C) (Oral)   Resp 18   Ht 5\' 6"  (1.676 m)   Wt (!) 142.9 kg   SpO2 100%   BMI 50.84 kg/m   Physical Exam Vitals signs and nursing note reviewed.  HENT:     Head: Normocephalic.  Neck:     Musculoskeletal: Normal range of motion.  Cardiovascular:     Rate and Rhythm: Normal rate.     Pulses: Normal  pulses.  Pulmonary:     Effort: Pulmonary effort is normal.  Abdominal:     General: Bowel sounds are normal.     Palpations: Abdomen is soft.  Musculoskeletal: Normal range of motion.  Skin:    General: Skin is warm.  Neurological:     General: No focal deficit present.     Mental Status: She is alert.  Psychiatric:        Mood and Affect: Mood normal.      ED Treatments / Results  Labs (all labs ordered are listed, but only abnormal results are displayed) Labs Reviewed - No data to display  EKG None  Radiology No results found.  Procedures Procedures (including critical care time)  Medications Ordered in ED Medications  methylPREDNISolone sodium succinate (SOLU-MEDROL) 125 mg/2 mL injection 125 mg (125 mg Intramuscular Given 08/16/18 1922)  ketorolac (TORADOL) injection 60 mg (60 mg Intramuscular Given 08/16/18 1922)  HYDROmorphone (DILAUDID) injection 1 mg (1 mg Intramuscular Given 08/16/18 1922)     Initial Impression / Assessment and Plan / ED Course  I have reviewed the triage vital signs and the nursing notes.  Pertinent labs & imaging results that were available during my care of the patient were reviewed by me and considered in my medical decision making (see chart for details).     Pt given solumedrol, torodol and dilaudid IM.   Pt given rx for robaxin and voltaren   Final Clinical Impressions(s) / ED Diagnoses   Final diagnoses:  Sciatica of right side    ED Discharge Orders         Ordered    diclofenac (VOLTAREN) 75 MG EC tablet  2 times daily     08/16/18 1941    methocarbamol (ROBAXIN) 500 MG tablet  2 times daily     08/16/18 1941           Sidney Ace 08/16/18 1955    Margette Fast, MD 08/17/18 [phone]

## 2018-08-16 NOTE — Discharge Instructions (Signed)
Return if any problems.

## 2018-08-16 NOTE — ED Triage Notes (Signed)
Lower back pain with radiation down her legs but worse in the right.

## 2018-08-30 ENCOUNTER — Encounter (INDEPENDENT_AMBULATORY_CARE_PROVIDER_SITE_OTHER): Payer: Self-pay | Admitting: Family Medicine

## 2018-08-30 ENCOUNTER — Ambulatory Visit (INDEPENDENT_AMBULATORY_CARE_PROVIDER_SITE_OTHER): Payer: 59 | Admitting: Family Medicine

## 2018-08-30 ENCOUNTER — Ambulatory Visit (INDEPENDENT_AMBULATORY_CARE_PROVIDER_SITE_OTHER): Payer: Self-pay

## 2018-08-30 DIAGNOSIS — M5442 Lumbago with sciatica, left side: Secondary | ICD-10-CM | POA: Diagnosis not present

## 2018-08-30 DIAGNOSIS — M5441 Lumbago with sciatica, right side: Secondary | ICD-10-CM

## 2018-08-30 DIAGNOSIS — C801 Malignant (primary) neoplasm, unspecified: Secondary | ICD-10-CM | POA: Insufficient documentation

## 2018-08-30 MED ORDER — TIZANIDINE HCL 2 MG PO TABS: 2.0000 mg | ORAL_TABLET | Freq: Four times a day (QID) | ORAL | 1 refills | Status: DC | PRN

## 2018-08-30 MED ORDER — TRAMADOL HCL 50 MG PO TABS: 50.0000 mg | ORAL_TABLET | Freq: Four times a day (QID) | ORAL | 0 refills | Status: DC | PRN

## 2018-08-30 MED ORDER — PREDNISONE 10 MG PO TABS: ORAL_TABLET | ORAL | 0 refills | Status: DC

## 2018-08-30 NOTE — Progress Notes (Signed)
Office Visit Note   Patient: Alejandra Figueroa           Date of Birth: 06-15-1986           MRN: [id number] Visit Date: 08/30/2018 Requested by: No referring provider defined for this encounter. PCP: Patient, No Pcp Per  Subjective: Chief Complaint  Patient presents with  . Lower Back - Pain    Pain x 2 months with radiating pain down one leg or the other/occasionally both, posterior legs to just above calf. No numbness nor tingling.    HPI: She is a 32 year old with low back pain.  Symptoms started a couple months ago, she was lying down watching TV and felt sudden onset of pain in her posterior hip.  She is continued to have pain since then, and recently got substantially worse while at work to the point that she could hardly walk.  She went to the hospital and was given injections of steroid and pain medicine which helped for a couple hours but then her pain came right back again.  She has been out of work since then with constant pain.  Pain radiates down the back of her legs but not below the knees, no weakness or numbness in her legs no bowel or bladder dysfunction.  She is never had problems with her back before this.              ROS: She is otherwise in good health, all systems are negative.  Objective: Vital Signs: There were no vitals taken for this visit.  Physical Exam:  Low back: Morbidly obese female in moderate discomfort.  Tender over the L5-S1 level and also in both sciatic notch areas.  Straight leg raise negative bilaterally with 5/5 lower extremity strength and 2+ DTRs.  Imaging: X-rays lumbar spine: L5-S1 degenerative disc disease, no compression fracture or acute abnormality.  Assessment & Plan: 1.  Low back pain with sciatica symptoms, neurologic exam nonfocal -New medications given, physical therapy referral.  Out of work for 2 weeks.  If symptoms do not improve then lumbar MRI scan.   Follow-Up Instructions: No follow-ups on file.       Procedures: No procedures performed  No notes on file    PMFS History: Patient Active Problem List   Diagnosis Date Noted  . Cancer ([place]) 08/30/2018  . Dysmenorrhea 10/04/2017  . Intramural and subserous leiomyoma of uterus 10/04/2017  . Prediabetes 07/21/2017  . Cyst of left ovary 07/20/2017  . Iron deficiency anemia 11/19/2016  . Breast implant deflation 01/30/2016  . S/P breast reconstruction, left 01/30/2016  . Sickle cell trait ([place]) 02/13/2013  . Polycystic ovarian syndrome 02/08/2013  . Endometrial thickening on ultra sound 12/30/2012  . Obesities, morbid ([place]) 12/09/2012  . Irregular menstrual cycle 12/09/2012  . Female infertility 12/09/2012  . History of chemotherapy 11/20/2002   Past Medical History:  Diagnosis Date  . Allergy   . Anemia   . Anxiety   . Cancer ([place]) 2006   breast  . PONV (postoperative nausea and vomiting)   . Seasonal allergies     Family History  Problem Relation Age of Onset  . Cancer Father        prostate  . Cancer Maternal Grandmother     Past Surgical History:  Procedure Laterality Date  . BREAST RECONSTRUCTION  2004   cancer  . COSMETIC SURGERY    . CYSTECTOMY    . HYSTEROSCOPY W/D&C N/A 01/18/2013   Procedure: DILATATION AND  CURETTAGE /HYSTEROSCOPY, diagnostic;  Surgeon: Lahoma Crocker, MD;  Location: [facility];  Service: Gynecology;  Laterality: N/A;  diagnostic  . MYOMECTOMY    . OVARY SURGERY    . WISDOM TOOTH EXTRACTION     Social History   Occupational History  . Occupation: Unemployed  Tobacco Use  . Smoking status: Never Smoker  . Smokeless tobacco: Never Used  Substance and Sexual Activity  . Alcohol use: No    Alcohol/week: 0.0 standard drinks  . Drug use: No  . Sexual activity: Yes    Partners: Male    Birth control/protection: Pill    Comment: not since surgery

## 2018-09-14 ENCOUNTER — Encounter (INDEPENDENT_AMBULATORY_CARE_PROVIDER_SITE_OTHER): Payer: Self-pay | Admitting: Family Medicine

## 2018-09-14 ENCOUNTER — Ambulatory Visit (INDEPENDENT_AMBULATORY_CARE_PROVIDER_SITE_OTHER): Payer: 59 | Admitting: Family Medicine

## 2018-09-14 DIAGNOSIS — M25552 Pain in left hip: Secondary | ICD-10-CM

## 2018-09-14 NOTE — Progress Notes (Signed)
Office Visit Note   Patient: Alejandra Figueroa           Date of Birth: 1986/06/30           MRN: [id number] Visit Date: 09/14/2018 Requested by: No referring provider defined for this encounter. PCP: Patient, No Pcp Per  Subjective: Chief Complaint  Patient presents with  . Lower Back - Pain    30% better than at last visit, post steroid pack - still having much pain back of left leg to buttock    HPI: She is here for follow-up left posterior hip pain.  Only slight improvement since last visit.  She is out of work, taking medications as needed.  Still having pain in the posterior hip radiating down toward her knee.  Hard to walk, also painful to sit.              ROS: Denies bowel or bladder dysfunction, fevers, chills, night sweats.  Objective: Vital Signs: There were no vitals taken for this visit.  Physical Exam:  Low back: No significant midline tenderness.  She is moderately tender in the left gluteus medius area.  Piriformis stretch is equivocal, straight leg raise is negative.  Lower extremity strength and reflexes are still normal.  Imaging: None today.  Assessment & Plan: 1.  Low back and left posterior hip pain, etiology uncertain but could be a lumbar disc protrusion.  Neurologic exam is nonfocal. -Out of work for a couple more weeks, start physical therapy.  Lumbar MRI scan if symptoms persist.   Follow-Up Instructions: Return in about 4 weeks (around 10/12/2018), or if symptoms worsen or fail to improve.      Procedures: No procedures performed  No notes on file    PMFS History: Patient Active Problem List   Diagnosis Date Noted  . Cancer ([place]) 08/30/2018  . Dysmenorrhea 10/04/2017  . Intramural and subserous leiomyoma of uterus 10/04/2017  . Prediabetes 07/21/2017  . Cyst of left ovary 07/20/2017  . Iron deficiency anemia 11/19/2016  . Breast implant deflation 01/30/2016  . S/P breast reconstruction, left 01/30/2016  . Sickle cell trait ([place])  02/13/2013  . Polycystic ovarian syndrome 02/08/2013  . Endometrial thickening on ultra sound 12/30/2012  . Obesities, morbid ([place]) 12/09/2012  . Irregular menstrual cycle 12/09/2012  . Female infertility 12/09/2012  . History of chemotherapy 11/20/2002   Past Medical History:  Diagnosis Date  . Allergy   . Anemia   . Anxiety   . Cancer ([place]) 2006   breast  . PONV (postoperative nausea and vomiting)   . Seasonal allergies     Family History  Problem Relation Age of Onset  . Cancer Father        prostate  . Cancer Maternal Grandmother     Past Surgical History:  Procedure Laterality Date  . BREAST RECONSTRUCTION  2004   cancer  . COSMETIC SURGERY    . CYSTECTOMY    . HYSTEROSCOPY W/D&C N/A 01/18/2013   Procedure: DILATATION AND CURETTAGE /HYSTEROSCOPY, diagnostic;  Surgeon: Lahoma Crocker, MD;  Location: [facility];  Service: Gynecology;  Laterality: N/A;  diagnostic  . MYOMECTOMY    . OVARY SURGERY    . WISDOM TOOTH EXTRACTION     Social History   Occupational History  . Occupation: Unemployed  Tobacco Use  . Smoking status: Never Smoker  . Smokeless tobacco: Never Used  Substance and Sexual Activity  . Alcohol use: No    Alcohol/week: 0.0 standard drinks  .  Drug use: No  . Sexual activity: Yes    Partners: Male    Birth control/protection: Pill    Comment: not since surgery

## 2018-09-15 ENCOUNTER — Telehealth (INDEPENDENT_AMBULATORY_CARE_PROVIDER_SITE_OTHER): Payer: Self-pay | Admitting: Family Medicine

## 2018-09-15 NOTE — Telephone Encounter (Signed)
Patient called asked if the same information can be faxed over to her HR Director extended her time out of work till 10/01/2018. The fax# is [phone]   The ph# is [phone]   The number to contact patient is [phone]

## 2018-09-16 NOTE — Telephone Encounter (Signed)
Note has been faxed. Patient advised.

## 2018-09-28 ENCOUNTER — Telehealth (INDEPENDENT_AMBULATORY_CARE_PROVIDER_SITE_OTHER): Payer: Self-pay | Admitting: Family Medicine

## 2018-09-28 NOTE — Telephone Encounter (Signed)
Pt called asking if Dr. Junius Roads would request anything other than PT. Patient states she is paying 75$ each visit and she cant afford that now at 2 times a week. She is still stating she is in a lot of pain.  Pt # [phone]

## 2018-09-29 ENCOUNTER — Other Ambulatory Visit (INDEPENDENT_AMBULATORY_CARE_PROVIDER_SITE_OTHER): Payer: Self-pay | Admitting: Family Medicine

## 2018-09-29 ENCOUNTER — Encounter (INDEPENDENT_AMBULATORY_CARE_PROVIDER_SITE_OTHER): Payer: Self-pay

## 2018-09-29 MED ORDER — ETODOLAC 400 MG PO TABS: 400.0000 mg | ORAL_TABLET | Freq: Two times a day (BID) | ORAL | 3 refills | Status: DC | PRN

## 2018-09-29 NOTE — Telephone Encounter (Signed)
Rx sent 

## 2018-09-29 NOTE — Telephone Encounter (Signed)
I called and advised the patient of the medication that was sent in to her pharmacy. She also mentioned that her HR was needing more information in her note as to why we extended her time out of work. New note was written and faxed to her HR Director [phone].

## 2018-09-29 NOTE — Telephone Encounter (Signed)
I called: patient was unable to go to even the first PT visit due to the cost. She continue to have pain in the buttock region and down the posterior thigh to the knee - mainly if she starts to stand up after sitting at least 15 minutes. She says the Tizanidine helps, but it makes her drowsy. The Tramadol she had before did not really help the pain - it just made her drowsy. She is requesting something to take during the day that will help with the pain and allow her to function (she is supposed to return to work on Monday 10/03/18).

## 2018-10-03 ENCOUNTER — Telehealth (INDEPENDENT_AMBULATORY_CARE_PROVIDER_SITE_OTHER): Payer: Self-pay | Admitting: Family Medicine

## 2018-10-03 DIAGNOSIS — M5441 Lumbago with sciatica, right side: Secondary | ICD-10-CM

## 2018-10-03 DIAGNOSIS — M5442 Lumbago with sciatica, left side: Secondary | ICD-10-CM

## 2018-10-03 NOTE — Telephone Encounter (Signed)
I called and advised. She said she has numbness now in the buttock down to posterior knee x 2 days. No new injury. Her pain is "almost back to before I saw the doctor." She is willing to try a chiropractor, if it is cheaper than PT - the cheapest PT she found with calling around was $60 per visit. Would she need to try this before an MRI is ordered?

## 2018-10-03 NOTE — Telephone Encounter (Signed)
Ok to keep out of work for another week, but she really needs to be doing something to get the pain to stop.  Just taking medicine is not going to solve the problem.  She either needs to go to PT, or else go to a chiropractor.

## 2018-10-03 NOTE — Telephone Encounter (Signed)
Order placed for referral to Dr. Jene Every.

## 2018-10-03 NOTE — Telephone Encounter (Signed)
I advised the patient of the referral. She will call HR and see what they need for her work extension - we will extend her note through 10/07/2018, to return on 10/10/2018. Will hold this message until she calls back.

## 2018-10-03 NOTE — Telephone Encounter (Signed)
Please advise. This was extended once to today's date. She could not afford the PT.

## 2018-10-03 NOTE — Telephone Encounter (Signed)
Patient called asked if Dr. Junius Roads will extend her Short Term Disability until after she finish the medication. (Tizanibine) Patient said he leg is hurting pretty bad even in her buttock area. Patient said it is hard for her to walk. The number to contact patient is [phone]

## 2018-10-17 ENCOUNTER — Encounter (INDEPENDENT_AMBULATORY_CARE_PROVIDER_SITE_OTHER): Payer: Self-pay

## 2018-10-17 ENCOUNTER — Telehealth (INDEPENDENT_AMBULATORY_CARE_PROVIDER_SITE_OTHER): Payer: Self-pay | Admitting: Family Medicine

## 2018-10-17 NOTE — Telephone Encounter (Signed)
Patient called and stated that she needed documentation for dtaes patient was out of work. Not showing in chart per [facility].  Please call patient to advise. [phone]

## 2018-10-17 NOTE — Telephone Encounter (Signed)
I called and spoke with the patient. A new work note needed to be written to reflect her whole time out of work. Returned to work on 10/10/2018. Note written - [facility] will access it through her chart.

## 2019-07-14 ENCOUNTER — Encounter: Payer: Self-pay | Admitting: Physician Assistant

## 2019-07-25 ENCOUNTER — Ambulatory Visit: Payer: 59 | Admitting: Physician Assistant

## 2019-08-11 ENCOUNTER — Other Ambulatory Visit: Payer: Self-pay | Admitting: Gastroenterology

## 2019-08-16 ENCOUNTER — Ambulatory Visit: Payer: 59 | Admitting: Gastroenterology

## 2019-08-18 ENCOUNTER — Other Ambulatory Visit (HOSPITAL_COMMUNITY)
Admission: RE | Admit: 2019-08-18 | Discharge: 2019-08-18 | Disposition: A | Discharge status: Home / Self Care | Payer: 59 | Source: Ambulatory Visit | Attending: Gastroenterology | Admitting: Gastroenterology

## 2019-08-18 DIAGNOSIS — Z20828 Contact with and (suspected) exposure to other viral communicable diseases: Secondary | ICD-10-CM | POA: Diagnosis not present

## 2019-08-18 DIAGNOSIS — Z01812 Encounter for preprocedural laboratory examination: Secondary | ICD-10-CM | POA: Diagnosis not present

## 2019-08-19 LAB — NOVEL CORONAVIRUS, NAA (HOSP ORDER, SEND-OUT TO REF LAB; TAT 18-24 HRS): SARS-CoV-2, NAA: NOT DETECTED

## 2019-08-22 ENCOUNTER — Other Ambulatory Visit: Payer: Self-pay

## 2019-08-22 ENCOUNTER — Encounter (HOSPITAL_COMMUNITY)
Admission: RE | Disposition: A | Discharge status: Home / Self Care | Payer: Self-pay | Source: Home / Self Care | Attending: Gastroenterology

## 2019-08-22 ENCOUNTER — Ambulatory Visit (HOSPITAL_COMMUNITY): Payer: No Typology Code available for payment source | Admitting: Registered Nurse

## 2019-08-22 ENCOUNTER — Ambulatory Visit (HOSPITAL_COMMUNITY)
Admission: RE | Admit: 2019-08-22 | Discharge: 2019-08-22 | Disposition: A | Discharge status: Home / Self Care | Payer: No Typology Code available for payment source | Attending: Gastroenterology | Admitting: Gastroenterology

## 2019-08-22 DIAGNOSIS — F419 Anxiety disorder, unspecified: Secondary | ICD-10-CM | POA: Insufficient documentation

## 2019-08-22 DIAGNOSIS — D124 Benign neoplasm of descending colon: Secondary | ICD-10-CM | POA: Insufficient documentation

## 2019-08-22 DIAGNOSIS — K921 Melena: Secondary | ICD-10-CM | POA: Insufficient documentation

## 2019-08-22 DIAGNOSIS — D125 Benign neoplasm of sigmoid colon: Secondary | ICD-10-CM | POA: Diagnosis not present

## 2019-08-22 DIAGNOSIS — Z6841 Body Mass Index (BMI) 40.0 and over, adult: Secondary | ICD-10-CM | POA: Diagnosis not present

## 2019-08-22 DIAGNOSIS — D122 Benign neoplasm of ascending colon: Secondary | ICD-10-CM | POA: Diagnosis not present

## 2019-08-22 HISTORY — PX: HEMOSTASIS CLIP PLACEMENT: SHX6857

## 2019-08-22 HISTORY — PX: POLYPECTOMY: SHX5525

## 2019-08-22 HISTORY — PX: COLONOSCOPY WITH PROPOFOL: SHX5780

## 2019-08-22 LAB — PREGNANCY, URINE: Preg Test, Ur: NEGATIVE

## 2019-08-22 SURGERY — COLONOSCOPY WITH PROPOFOL
Anesthesia: General

## 2019-08-22 MED ORDER — METOCLOPRAMIDE HCL 5 MG/ML IJ SOLN
INTRAMUSCULAR | Status: DC | PRN
  Administered 2019-08-22: 5 mg via INTRAVENOUS

## 2019-08-22 MED ORDER — DEXAMETHASONE SODIUM PHOSPHATE 4 MG/ML IJ SOLN
INTRAMUSCULAR | Status: DC | PRN
  Administered 2019-08-22: 8 mg via INTRAVENOUS

## 2019-08-22 MED ORDER — PROPOFOL 500 MG/50ML IV EMUL
INTRAVENOUS | Status: DC | PRN
  Administered 2019-08-22: 20 mg via INTRAVENOUS
  Administered 2019-08-22: 160 mg via INTRAVENOUS
  Administered 2019-08-22: 170 ug/kg/min via INTRAVENOUS

## 2019-08-22 MED ORDER — SUCCINYLCHOLINE CHLORIDE 200 MG/10ML IV SOSY
PREFILLED_SYRINGE | INTRAVENOUS | Status: DC | PRN
  Administered 2019-08-22: 160 mg via INTRAVENOUS

## 2019-08-22 MED ORDER — SODIUM CHLORIDE 0.9 % IV SOLN: INTRAVENOUS | Status: DC

## 2019-08-22 MED ORDER — PROPOFOL 500 MG/50ML IV EMUL
INTRAVENOUS | Status: AC
  Filled 2019-08-22: qty 50

## 2019-08-22 MED ORDER — LACTATED RINGERS IV SOLN: INTRAVENOUS | Status: DC

## 2019-08-22 MED ORDER — ONDANSETRON HCL 4 MG/2ML IJ SOLN
INTRAMUSCULAR | Status: DC | PRN
  Administered 2019-08-22: 4 mg via INTRAVENOUS

## 2019-08-22 SURGICAL SUPPLY — 22 items

## 2019-08-22 NOTE — Anesthesia Preprocedure Evaluation (Signed)
Anesthesia Evaluation  Patient identified by MRN, date of birth, ID band Patient awake    Reviewed: Allergy & Precautions, NPO status , Patient's Chart, lab work & pertinent test results  History of Anesthesia Complications (+) PONV and history of anesthetic complications  Airway Mallampati: II  TM Distance: >3 FB Neck ROM: Full    Dental  (+) Dental Advisory Given, Teeth Intact   Pulmonary neg recent URI,    breath sounds clear to auscultation       Cardiovascular negative cardio ROS   Rhythm:Regular     Neuro/Psych PSYCHIATRIC DISORDERS Anxiety negative neurological ROS     GI/Hepatic Neg liver ROS,   Endo/Other  Morbid obesity  Renal/GU negative Renal ROS     Musculoskeletal   Abdominal   Peds  Hematology  (+) Blood dyscrasia, anemia ,   Anesthesia Other Findings   Reproductive/Obstetrics                             Anesthesia Physical Anesthesia Plan  ASA: II  Anesthesia Plan: MAC   Post-op Pain Management:    Induction: Intravenous  PONV Risk Score and Plan: 3 and Propofol infusion and Treatment may vary due to age or medical condition  Airway Management Planned: Nasal Cannula  Additional Equipment: None  Intra-op Plan:   Post-operative Plan:   Informed Consent: I have reviewed the patients History and Physical, chart, labs and discussed the procedure including the risks, benefits and alternatives for the proposed anesthesia with the patient or authorized representative who has indicated his/her understanding and acceptance.     Dental advisory given  Plan Discussed with: CRNA and Surgeon  Anesthesia Plan Comments:         Anesthesia Quick Evaluation

## 2019-08-22 NOTE — Op Note (Signed)
[facility] Patient Name: Alejandra Figueroa Procedure Date: 08/22/2019 MRN: [id number] Attending MD: Carol Ada , MD Date of Birth: 1986-05-27 CSN: [id number] Age: 33 Admit Type: Outpatient Procedure:                Colonoscopy Indications:              Hematochezia Providers:                Carol Ada, MD, Benay Pillow, RN, Cletis Athens,                            Technician Referring MD:              Medicines:                General Anesthesia Complications:            No immediate complications. Estimated Blood Loss:     Estimated blood loss: none. Procedure:                Pre-Anesthesia Assessment:                           - Prior to the procedure, a History and Physical                            was performed, and patient medications and                            allergies were reviewed. The patient's tolerance of                            previous anesthesia was also reviewed. The risks                            and benefits of the procedure and the sedation                            options and risks were discussed with the patient.                            All questions were answered, and informed consent                            was obtained. Prior Anticoagulants: The patient has                            taken no previous anticoagulant or antiplatelet                            agents. ASA Grade Assessment: III - A patient with                            severe systemic disease. After reviewing the risks                            and benefits, the patient was  deemed in                            satisfactory condition to undergo the procedure.                           - Sedation was administered by an anesthesia                            professional. General anesthesia was attained.                           After obtaining informed consent, the colonoscope                            was passed under direct vision. Throughout the                       procedure, the patient's blood pressure, pulse, and                            oxygen saturations were monitored continuously. The                            [id number] [id number] Olympus colonoscope was                            introduced through the anus and advanced to the the                            cecum, identified by appendiceal orifice and                            ileocecal valve. The colonoscopy was performed                            without difficulty. The patient tolerated the                            procedure well. The quality of the bowel                            preparation was excellent. The ileocecal valve,                            appendiceal orifice, and rectum were photographed. Scope In: 1:58:20 PM Scope Out: 2:23:40 PM Scope Withdrawal Time: 0 hours 18 minutes 3 seconds  Total Procedure Duration: 0 hours 25 minutes 20 seconds  Findings:      Three sessile polyps were found in the descending colon, transverse       colon and ascending colon. The polyps were 2 to 3 mm in size. These       polyps were removed with a cold snare. Resection was complete, but the       polyp tissue was only partially retrieved.      A 18 mm polyp was found  in the sigmoid colon. The polyp was       pedunculated. The polyp was removed with a hot snare. Resection and       retrieval were complete. To prevent bleeding post-intervention, three       hemostatic clips were successfully placed (MR unsafe). There was no       bleeding at the end of the procedure.      Initial entry into the colon revealed fresh blood in the lumen. The       source of bleeding was a large 18 mm pedunculated descending colon       polyp. This was removed with a hot snare and three hemoclips were       deployed to secure the polypectomy site. Impression:               - Three 2 to 3 mm polyps in the descending colon,                            in the transverse colon and in the  ascending colon,                            removed with a cold snare. Complete resection.                            Partial retrieval.                           - One 18 mm polyp in the sigmoid colon, removed                            with a hot snare. Resected and retrieved. Clips (MR                            unsafe) were placed. Moderate Sedation:      Not Applicable - Patient had care per Anesthesia. Recommendation:           - Patient has a contact number available for                            emergencies. The signs and symptoms of potential                            delayed complications were discussed with the                            patient. Return to normal activities tomorrow.                            Written discharge instructions were provided to the                            patient.                           - Resume previous diet.                           -  Continue present medications.                           - Await pathology results.                           - Repeat colonoscopy in 3 years for surveillance. Procedure Code(s):        --- Professional ---                           [phone], Colonoscopy, flexible; with removal of                            tumor(s), polyp(s), or other lesion(s) by snare                            technique Diagnosis Code(s):        --- Professional ---                           K63.5, Polyp of colon                           K92.1, Melena (includes Hematochezia) CPT copyright 2019 American Medical Association. All rights reserved. The codes documented in this report are preliminary and upon coder review may  be revised to meet current compliance requirements. Carol Ada, MD Carol Ada, MD 08/22/2019 2:29:09 PM This report has been signed electronically. Number of Addenda: 0

## 2019-08-22 NOTE — Anesthesia Procedure Notes (Signed)
Procedure Name: Intubation Date/Time: 08/22/2019 1:54 PM Performed by: Victoriano Lain, CRNA Pre-anesthesia Checklist: Patient identified, Emergency Drugs available, Suction available, Patient being monitored and Timeout performed Patient Re-evaluated:Patient Re-evaluated prior to induction Oxygen Delivery Method: Circle system utilized Preoxygenation: Pre-oxygenation with 100% oxygen Induction Type: IV induction, Rapid sequence and Cricoid Pressure applied Laryngoscope Size: Mac and 4 Grade View: Grade I Tube type: Oral Tube size: 7.5 mm Number of attempts: 1 Airway Equipment and Method: Stylet Placement Confirmation: ETT inserted through vocal cords under direct vision,  positive ETCO2 and breath sounds checked- equal and bilateral Secured at: 21 cm Tube secured with: Tape Dental Injury: Teeth and Oropharynx as per pre-operative assessment

## 2019-08-22 NOTE — Transfer of Care (Signed)
Immediate Anesthesia Transfer of Care Note  Patient: Starke  Procedure(s) Performed: COLONOSCOPY WITH PROPOFOL (N/A ) HOT HEMOSTASIS (ARGON PLASMA COAGULATION/BICAP) (N/A ) POLYPECTOMY HEMOSTASIS CLIP PLACEMENT  Patient Location: PACU and Endoscopy Unit  Anesthesia Type:MAC  Level of Consciousness: awake, alert , oriented and patient cooperative  Airway & Oxygen Therapy: Patient Spontanous Breathing and Patient connected to face mask oxygen  Post-op Assessment: Report given to RN, Post -op Vital signs reviewed and stable and Patient moving all extremities  Post vital signs: Reviewed and stable  Last Vitals:  Vitals Value Taken Time  BP 151/80 08/22/19 1440  Temp    Pulse 112 08/22/19 1440  Resp 20 08/22/19 1440  SpO2 100 % 08/22/19 1440  Vitals shown include unvalidated device data.  Last Pain:  Vitals:   08/22/19 1247  TempSrc: Oral  PainSc: 0-No pain         Complications: No apparent anesthesia complications

## 2019-08-22 NOTE — Discharge Instructions (Signed)

## 2019-08-22 NOTE — H&P (Signed)
  Alejandra Figueroa HPI: This 33 year old black female presents to the office for further evaluation of rectal bleeding that started 3 weeks ago. She has bright red blood in the stool. She has rectal pain and rectal itching after a BM. She has hemorrhoids that flare up periodically. She has a change in bowel habits. She has had problems with constipation that started 2 weeks ago. She has fecal urgency. She feels it takes longer to facilitate a BM. She has a sense of incomplete evacuation. She takes 2 stool softener every other day. She has 1 BM per day. She has had a lot of gas, bloating and abdominal cramping. She has irregular menstrual cycles and not sure if the cramping is related to the cycles. The abdominal cramping stopped after she started the Medroxyprogesterone back, after discontinuing for about 2-3 months. She has good appetite and her weight has been stable. She denies having any complaints of abdominal pain, nausea, vomiting, acid reflux, dysphagia or odynophagia. She denies having a family history of colon cancer, celiac sprue or IBD.    Past Medical History:  Diagnosis Date  . Allergy   . Anemia   . Anxiety   . Cancer ([place]) 2006   breast  . PONV (postoperative nausea and vomiting)   . Seasonal allergies     Past Surgical History:  Procedure Laterality Date  . BREAST RECONSTRUCTION  2004   cancer  . COSMETIC SURGERY    . CYSTECTOMY    . HYSTEROSCOPY WITH D & C N/A 01/18/2013   Procedure: DILATATION AND CURETTAGE /HYSTEROSCOPY, diagnostic;  Surgeon: Lahoma Crocker, MD;  Location: [facility];  Service: Gynecology;  Laterality: N/A;  diagnostic  . MYOMECTOMY    . OVARY SURGERY    . WISDOM TOOTH EXTRACTION      Family History  Problem Relation Age of Onset  . Cancer Father        prostate  . Cancer Maternal Grandmother     Social History:  reports that she has never smoked. She has never used smokeless tobacco. She reports that she does not drink alcohol or use  drugs.  Allergies:  Allergies  Allergen Reactions  . Other Swelling    Cinnamon gum; swelling of throat Cinnamon gum; swelling of throat   . Morphine And Related Itching and Nausea And Vomiting  . Nystatin Swelling  . Peanut-Containing Drug Products     Throat swelling and itching. Gets really hot    Medications: Scheduled: Continuous:  No results found for this or any previous visit (from the past 24 hour(s)).   No results found.  ROS:  As stated above in the HPI otherwise negative.  There were no vitals taken for this visit.    PE: Gen: NAD, Alert and Oriented HEENT:  [place]/AT, EOMI Neck: Supple, no LAD Lungs: CTA Bilaterally CV: RRR without M/G/R ABM: Soft, NTND, +BS Ext: No C/C/E  Assessment/Plan: 1) Hematochezia - colonosscopy.  Hadli Vandemark D 08/22/2019, 12:31 PM

## 2019-08-23 LAB — SURGICAL PATHOLOGY

## 2019-08-25 NOTE — Anesthesia Postprocedure Evaluation (Signed)
Anesthesia Post Note  Patient: Alejandra Figueroa  Procedure(s) Performed: COLONOSCOPY WITH PROPOFOL (N/A ) HOT HEMOSTASIS (ARGON PLASMA COAGULATION/BICAP) (N/A ) POLYPECTOMY HEMOSTASIS CLIP PLACEMENT     Patient location during evaluation: PACU Anesthesia Type: General Level of consciousness: awake and alert Pain management: pain level controlled Vital Signs Assessment: post-procedure vital signs reviewed and stable Respiratory status: spontaneous breathing, nonlabored ventilation, respiratory function stable and patient connected to nasal cannula oxygen Cardiovascular status: blood pressure returned to baseline and stable Postop Assessment: no apparent nausea or vomiting Anesthetic complications: no    Last Vitals:  Vitals:   08/22/19 1510 08/22/19 1520  BP: (!) 133/93 (!) 157/91  Pulse: 100 91  Resp: 12 17  Temp:    SpO2: 98% 99%    Last Pain:  Vitals:   08/22/19 1520  TempSrc:   PainSc: 0-No pain                 Demond Shallenberger

## 2019-11-10 ENCOUNTER — Other Ambulatory Visit: Payer: Self-pay | Admitting: Obstetrics & Gynecology

## 2019-11-10 DIAGNOSIS — N6320 Unspecified lump in the left breast, unspecified quadrant: Secondary | ICD-10-CM

## 2019-11-17 ENCOUNTER — Other Ambulatory Visit: Payer: Self-pay | Admitting: Obstetrics & Gynecology

## 2019-11-17 DIAGNOSIS — N6320 Unspecified lump in the left breast, unspecified quadrant: Secondary | ICD-10-CM

## 2019-11-21 ENCOUNTER — Ambulatory Visit
Admission: RE | Admit: 2019-11-21 | Discharge: 2019-11-21 | Disposition: A | Discharge status: Home / Self Care | Payer: 59 | Source: Ambulatory Visit | Attending: Obstetrics & Gynecology | Admitting: Obstetrics & Gynecology

## 2019-11-21 ENCOUNTER — Other Ambulatory Visit: Payer: Self-pay

## 2019-11-21 DIAGNOSIS — N6320 Unspecified lump in the left breast, unspecified quadrant: Secondary | ICD-10-CM

## 2019-11-29 ENCOUNTER — Other Ambulatory Visit: Payer: Self-pay | Admitting: Obstetrics & Gynecology

## 2019-11-29 DIAGNOSIS — D251 Intramural leiomyoma of uterus: Secondary | ICD-10-CM

## 2019-11-30 ENCOUNTER — Other Ambulatory Visit: Payer: 59

## 2019-12-12 ENCOUNTER — Ambulatory Visit (HOSPITAL_COMMUNITY)
Admission: RE | Admit: 2019-12-12 | Discharge: 2019-12-12 | Disposition: A | Discharge status: Home / Self Care | Payer: 59 | Source: Ambulatory Visit | Attending: Obstetrics & Gynecology | Admitting: Obstetrics & Gynecology

## 2019-12-12 ENCOUNTER — Other Ambulatory Visit: Payer: Self-pay

## 2019-12-12 DIAGNOSIS — D252 Subserosal leiomyoma of uterus: Secondary | ICD-10-CM | POA: Insufficient documentation

## 2019-12-12 DIAGNOSIS — D251 Intramural leiomyoma of uterus: Secondary | ICD-10-CM | POA: Diagnosis not present

## 2019-12-24 IMAGING — XA DG FLUORO GUIDE NDL PLC/BX
2 series · 2 of 2 positions shown · IV contrast (multihance)
Comparison: none

CLINICAL DATA: Right shoulder pain.

EXAM:
RIGHT SHOULDER INJECTION UNDER FLUOROSCOPY
TECHNIQUE: An appropriate skin entrance site was determined. The site was
marked, prepped with Betadine, draped in the usual sterile fashion,
and infiltrated locally with buffered Lidocaine. A 22 gauge spinal
needle was advanced to the superomedial margin of the humeral head
under intermittent fluoroscopy. 1 mL of 1% lidocaine injected
easily. A mixture of 0.1 mL of MultiHance, 10 mL of Isovue-M 200,
and 10 mL of sterile saline was then used to opacify the right
shoulder capsule. 13 mL of this mixture were injected. No immediate
complication.
FLUOROSCOPY TIME:  Fluoroscopy Time:  3 seconds
Radiation Exposure Index (if provided by the fluoroscopic device):
19.13 microGray*m^2
Number of Acquired Spot Images: 0

[Series 1: ortho adipose · 1 of 1 slices shown (1 of 2)]
[im 1/1]
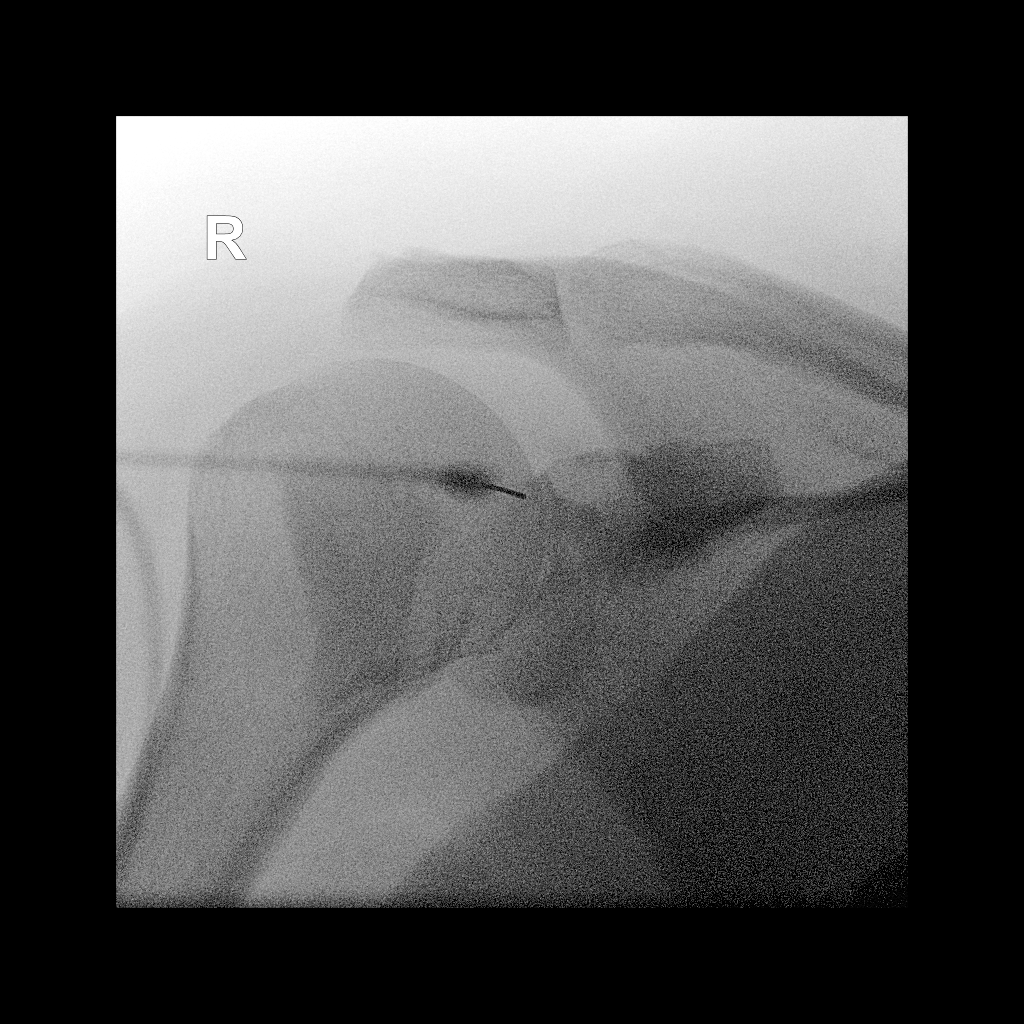

[Series 2: ortho adipose · 1 of 1 slices shown (2 of 2)]
[im 1/1]
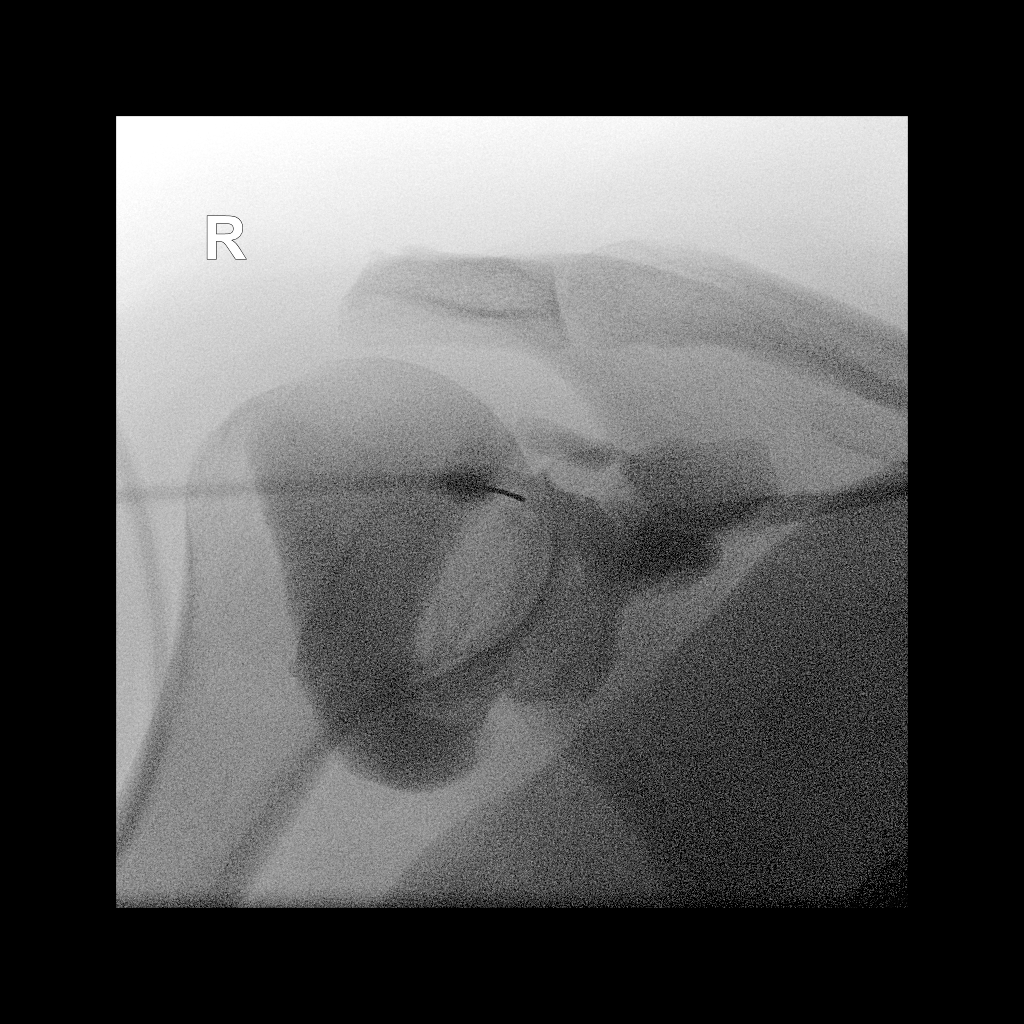

[2 of 2 positions shown; findings below may reference images not displayed]

IMPRESSION: Technically successful right shoulder injection for MRI.

## 2020-01-05 ENCOUNTER — Other Ambulatory Visit: Payer: Self-pay | Admitting: Obstetrics & Gynecology

## 2020-01-05 DIAGNOSIS — G8929 Other chronic pain: Secondary | ICD-10-CM

## 2020-01-05 DIAGNOSIS — N979 Female infertility, unspecified: Secondary | ICD-10-CM

## 2020-01-05 DIAGNOSIS — R102 Pelvic and perineal pain: Secondary | ICD-10-CM

## 2020-05-16 ENCOUNTER — Encounter: Payer: Self-pay | Admitting: Gastroenterology

## 2020-05-16 ENCOUNTER — Ambulatory Visit (INDEPENDENT_AMBULATORY_CARE_PROVIDER_SITE_OTHER): Payer: Self-pay | Admitting: Gastroenterology

## 2020-05-16 VITALS — BP 130/86 | HR 84 | Ht 65.5 in | Wt 317.5 lb

## 2020-05-16 DIAGNOSIS — Z86010 Personal history of colonic polyps: Secondary | ICD-10-CM

## 2020-05-16 DIAGNOSIS — K6289 Other specified diseases of anus and rectum: Secondary | ICD-10-CM

## 2020-05-16 DIAGNOSIS — K5909 Other constipation: Secondary | ICD-10-CM

## 2020-05-16 MED ORDER — HYDROCORTISONE (PERIANAL) 2.5 % EX CREA: 1.0000 "application " | TOPICAL_CREAM | Freq: Two times a day (BID) | CUTANEOUS | 1 refills | Status: DC

## 2020-05-16 NOTE — Patient Instructions (Signed)
If you are age 35 or older, your body mass index should be between 23-30. Your Body mass index is 52.03 kg/m. If this is out of the aforementioned range listed, please consider follow up with your Primary Care Provider.  If you are age 39 or younger, your body mass index should be between 19-25. Your Body mass index is 52.03 kg/m. If this is out of the aformentioned range listed, please consider follow up with your Primary Care Provider.   We have sent the following medications to your pharmacy for you to pick up at your convenience: Hydrocortisone 2.5 % twice daily as needed.  Make changes to fiber supplements as discussed.  Consider adding Miralax daily.

## 2020-05-16 NOTE — Progress Notes (Signed)
05/16/2020 Alejandra Figueroa [id number] 1986/05/07   HISTORY OF PRESENT ILLNESS:  This is a pleasant 34 year old female who is new to our office.  She actually had a colonoscopy performed by Dr. Benson Norway in December 2020 for complaints of rectal bleeding.  That showed the following:  - Three 2 to 3 mm polyps in the descending colon, in the transverse colon and in the ascending colon, removed with a cold snare. Complete resection. Partial retrieval. - One 18 mm polyp in the sigmoid colon, removed with a hot snare. Resected and retrieved. Clips (MR unsafe) were placed.   FINAL MICROSCOPIC DIAGNOSIS:   A. COLON, ASCENDING, POLYPECTOMY:  - Sessile serrated polyp (2 of 2 fragments)  - No high grade dysplasia or malignancy identified   B. COLON, DESCENDING, POLYPECTOMY:  - Benign colonic mucosa with lymphoid aggregates (1 of 1 fragments)  - No high grade dysplasia or malignancy identified   C. COLON, SIGMOID, POLYPECTOMY:  - Hamartomatous appearing polyp   She now wishes to be a patient in our office.  She is here today with complaints of constipation and rectal pain as well as abdominal pain.  She says that she seems to be a little bit more hypervigilant now since she had the polyps removed.  Worries a little bit more.  She was having intermittent generalized abdominal pain for couple weeks, but that has resolved.  She has also been experiencing some constipation.  Says that she noted it mostly when she was on vacation and she did take some stool softeners.  She has used some fiber supplements in the past.  She says that intermittently she feels like there is a small cut in her anal area when having a bowel movement.  She denies any bleeding.  Had a mammary angiosarcoma at age 58.  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   Colon polyps    PONV (postoperative nausea and vomiting)     Seasonal allergies    Past Surgical History:  Procedure Laterality Date   BREAST RECONSTRUCTION  2004   cancer   COLONOSCOPY WITH PROPOFOL N/A 08/22/2019   Procedure: COLONOSCOPY WITH PROPOFOL;  Surgeon: Carol Ada, MD;  Location: [facility];  Service: Endoscopy;  Laterality: N/A;   COSMETIC SURGERY     CYSTECTOMY     HEMOSTASIS CLIP PLACEMENT  08/22/2019   Procedure: HEMOSTASIS CLIP PLACEMENT;  Surgeon: Carol Ada, MD;  Location: [facility];  Service: Endoscopy;;   HYSTEROSCOPY WITH D & C N/A 01/18/2013   Procedure: DILATATION AND CURETTAGE /HYSTEROSCOPY, diagnostic;  Surgeon: Lahoma Crocker, MD;  Location: [facility];  Service: Gynecology;  Laterality: N/A;  diagnostic   MASTECTOMY Left    MYOMECTOMY     OVARY SURGERY     POLYPECTOMY  08/22/2019   Procedure: POLYPECTOMY;  Surgeon: Carol Ada, MD;  Location: [facility];  Service: Endoscopy;;   REDUCTION MAMMAPLASTY Right    WISDOM TOOTH EXTRACTION      reports that she has never smoked. She has never used smokeless tobacco. She reports current alcohol use. She reports that she does not use drugs. family history includes Bone cancer in her maternal grandmother; Breast cancer in her maternal great-grandmother; Peptic Ulcer in her mother; Prostate cancer in her father. Allergies  Allergen Reactions   Other Swelling    Cinnamon gum; swelling of throat Cinnamon gum; swelling of  throat    Morphine And Related Itching and Nausea And Vomiting   Nystatin Swelling   Peanut-Containing Drug Products     Throat swelling and itching. Gets really hot      Outpatient Encounter Medications as of 05/16/2020  Medication Sig   ALPRAZolam (XANAX) 1 MG tablet Take 1 mg by mouth 2 (two) times daily as needed for anxiety.   docusate sodium (COLACE) 100 MG capsule Take 200 mg by mouth daily.   ibuprofen (ADVIL) 200 MG tablet Take 600-800 mg by mouth every 8 (eight) hours as needed (pain.).   medroxyPROGESTERone  (PROVERA) 10 MG tablet Take 1 tablet by mouth. For 12 days until the next month   Multiple Vitamin (MULTIVITAMIN WITH MINERALS) TABS tablet Take 1 tablet by mouth every evening. One-A-Day Multivitamin   polyethylene glycol powder (GLYCOLAX/MIRALAX) 17 GM/SCOOP powder Take 17 g by mouth daily.   Probiotic Product (PROBIOTIC PO) Take 1 capsule by mouth daily.   Psyllium (METAMUCIL PO) Take 1 Dose by mouth in the morning and at bedtime.   No facility-administered encounter medications on file as of 05/16/2020.     REVIEW OF SYSTEMS  : All other systems reviewed and negative except where noted in the History of Present Illness.   PHYSICAL EXAM: BP 130/86 (BP Location: Left Arm, Patient Position: Sitting, Cuff Size: Large)    Pulse 84    Ht 5' 5.5" (1.664 m) Comment: height measured without shoes   Wt (!) 317 lb 8 oz (144 kg)    LMP 03/15/2020    BMI 52.03 kg/m  General: Well developed AA female in no acute distress Head: Normocephalic and atraumatic Eyes:  Sclerae anicteric, conjunctiva pink. Ears: Normal auditory acuity Lungs: Clear throughout to auscultation; no W/R/R. Heart: Regular rate and rhythm; no M/R/G. Abdomen: Soft, non-distended.  BS present. Non-tender. Rectal:  No external abnormalities noted at the time of my exam.  No tenderness.  DRE did not reveal any masses. Musculoskeletal: Symmetrical with no gross deformities  Skin: No lesions on visible extremities Extremities: No edema  Neurological: Alert oriented x 4, grossly non-focal Psychological:  Alert and cooperative. Normal mood and affect  ASSESSMENT AND PLAN: *Constipation:  Needs to keep bowels movements soft and moving well.  We discussed taking a daily fiber supplement and possibly adding Miralax daily as well. *Rectal/anal pain:  Occurs intermittently with bowel movements.  No definite abnormalities noted on exam today, but will give hydrocortisone cream to use prn.  Prescription sent to pharmacy.  Goal is to  treat constipation as above. *Personal history of colon polyps:  Had a sessile serrated polyp and a large hamartomatous appearing polyp in 08/2019.  Repeat recommended at 3 year interval.  Will enter recall for 07/2022.  CC:  No ref. provider found

## 2020-05-29 ENCOUNTER — Encounter: Payer: Self-pay | Admitting: Gastroenterology

## 2020-05-29 DIAGNOSIS — Z86010 Personal history of colonic polyps: Secondary | ICD-10-CM | POA: Insufficient documentation

## 2020-05-29 DIAGNOSIS — K5909 Other constipation: Secondary | ICD-10-CM | POA: Insufficient documentation

## 2020-05-29 DIAGNOSIS — K6289 Other specified diseases of anus and rectum: Secondary | ICD-10-CM | POA: Insufficient documentation

## 2020-05-29 NOTE — Progress Notes (Signed)
Agree with the assessment and plan as outlined by Alonza Bogus, PA-C.  The single large hamartomatous polyp is an interesting finding. As a single lesion, this is most likely a benign juvenile polyp, and less likely to be part of a syndrome such as Juvenile Polyposis Syndrome, Peutz Jeghers, Cowden's, etc. Otherwise, juvenile polyps do not convey increased risk or require additional screening studies. Agree with recommendations for treatment of active GI sxs along with recall as outlined for polyp surveillance given additional finding of multiple SSPs.   Jola Critzer, DO, [facility]

## 2020-06-27 ENCOUNTER — Ambulatory Visit (INDEPENDENT_AMBULATORY_CARE_PROVIDER_SITE_OTHER): Payer: 59 | Admitting: Otolaryngology

## 2020-06-27 ENCOUNTER — Encounter (INDEPENDENT_AMBULATORY_CARE_PROVIDER_SITE_OTHER): Payer: Self-pay | Admitting: Otolaryngology

## 2020-06-27 ENCOUNTER — Other Ambulatory Visit: Payer: Self-pay

## 2020-06-27 VITALS — Temp 97.7°F

## 2020-06-27 DIAGNOSIS — R590 Localized enlarged lymph nodes: Secondary | ICD-10-CM

## 2020-06-27 NOTE — Progress Notes (Signed)
HPI: Alejandra Figueroa is a 34 y.o. female who presents for evaluation of enlarged lymph nodes in the submental region.  She has noted these for the past several months.  She was seen in urgent care and they referred her here. Denies sore throat or any trouble swallowing.  She feels lymph nodes but they have not been tender.  Past Medical History:  Diagnosis Date  . Allergy   . Anemia   . Anxiety   . 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 Colon polyps   . PONV (postoperative nausea and vomiting)   . Seasonal allergies    Past Surgical History:  Procedure Laterality Date  . BREAST RECONSTRUCTION  2004   cancer  . COLONOSCOPY WITH PROPOFOL N/A 08/22/2019   Procedure: COLONOSCOPY WITH PROPOFOL;  Surgeon: Carol Ada, MD;  Location: [facility];  Service: Endoscopy;  Laterality: N/A;  . COSMETIC SURGERY    . CYSTECTOMY    . HEMOSTASIS CLIP PLACEMENT  08/22/2019   Procedure: HEMOSTASIS CLIP PLACEMENT;  Surgeon: Carol Ada, MD;  Location: [facility];  Service: Endoscopy;;  . HYSTEROSCOPY WITH D & C N/A 01/18/2013   Procedure: DILATATION AND CURETTAGE /HYSTEROSCOPY, diagnostic;  Surgeon: Lahoma Crocker, MD;  Location: [facility];  Service: Gynecology;  Laterality: N/A;  diagnostic  . MASTECTOMY Left   . MYOMECTOMY    . OVARY SURGERY    . POLYPECTOMY  08/22/2019   Procedure: POLYPECTOMY;  Surgeon: Carol Ada, MD;  Location: [facility];  Service: Endoscopy;;  . REDUCTION MAMMAPLASTY Right   . WISDOM TOOTH EXTRACTION     Social History   Socioeconomic History  . Marital status: Married    Spouse name: Mirant  . Number of children: 0  . Years of education: Not on file  . Highest education level: Not on file  Occupational History  . Occupation: Unemployed  Tobacco Use  . Smoking status: Never Smoker  . Smokeless tobacco: Never Used  Vaping Use  . Vaping Use: Never used  Substance and Sexual Activity  . Alcohol use: Yes    Alcohol/week: 0.0  standard drinks    Comment: occasional  . Drug use: No  . Sexual activity: Yes    Partners: Male    Birth control/protection: Pill    Comment: not since surgery  Other Topics Concern  . Not on file  Social History Narrative  . Not on file   Social Determinants of Health   Financial Resource Strain:   . Difficulty of Paying Living Expenses: Not on file  Food Insecurity:   . Worried About Charity fundraiser in the Last Year: Not on file  . Ran Out of Food in the Last Year: Not on file  Transportation Needs:   . Lack of Transportation (Medical): Not on file  . Lack of Transportation (Non-Medical): Not on file  Physical Activity:   . Days of Exercise per Week: Not on file  . Minutes of Exercise per Session: Not on file  Stress:   . Feeling of Stress : Not on file  Social Connections:   . Frequency of Communication with Friends and Family: Not on file  . Frequency of Social Gatherings with Friends and Family: Not on file  . Attends Religious Services: Not on file  . Active Member of Clubs or Organizations: Not on file  . Attends Archivist Meetings: Not on file  . Marital Status: Not on file   Family History  Problem  Relation Age of Onset  . Prostate cancer Father   . Peptic Ulcer Mother   . Bone cancer Maternal Grandmother   . Breast cancer Maternal Great-grandmother    Allergies  Allergen Reactions  . Other Swelling    Cinnamon gum; swelling of throat Cinnamon gum; swelling of throat   . Morphine And Related Itching and Nausea And Vomiting  . Nystatin Swelling  . Peanut-Containing Drug Products     Throat swelling and itching. Gets really hot   Prior to Admission medications   Medication Sig Start Date End Date Taking? Authorizing Provider  ALPRAZolam Duanne Moron) 1 MG tablet Take 1 mg by mouth 2 (two) times daily as needed for anxiety. 08/10/19  Yes [provider]  docusate sodium (COLACE) 100 MG capsule Take 200 mg by mouth daily.   Yes [provider]  hydrocortisone (ANUSOL-HC) 2.5 % rectal cream Place 1 application rectally 2 (two) times daily. 05/16/20  Yes Zehr, Laban Emperor, PA-C  ibuprofen (ADVIL) 200 MG tablet Take 600-800 mg by mouth every 8 (eight) hours as needed (pain.).   Yes [provider]  medroxyPROGESTERone (PROVERA) 10 MG tablet Take 1 tablet by mouth. For 12 days until the next month 04/10/20  Yes [provider]  Multiple Vitamin (MULTIVITAMIN WITH MINERALS) TABS tablet Take 1 tablet by mouth every evening. One-A-Day Multivitamin   Yes [provider]  polyethylene glycol powder (GLYCOLAX/MIRALAX) 17 GM/SCOOP powder Take 17 g by mouth daily.   Yes [provider]  Probiotic Product (PROBIOTIC PO) Take 1 capsule by mouth daily.   Yes [provider]  Psyllium (METAMUCIL PO) Take 1 Dose by mouth in the morning and at bedtime.   Yes [provider]     Positive ROS: Otherwise negative  All other systems have been reviewed and were otherwise negative with the exception of those mentioned in the HPI and as above.  Physical Exam: Constitutional: Alert, well-appearing, no acute distress. Ears: External ears without lesions or tenderness. Ear canals are clear bilaterally with intact, clear TMs.  Nasal: External nose without lesions. Septum midline. Clear nasal passages bilaterally. Oral: Lips and gums without lesions. Tongue and palate mucosa without lesions. Posterior oropharynx clear.  Moderate sized tonsils bilaterally which are symmetric and 2+.  No acute exudate.  Floor of mouth is normal.  Lips are normal.  Of note patient has a very strong gag reflex. Neck: No palpable adenopathy or masses.  On palpation of the submental region she has small 1 cm mobile lymph nodes on both sides.  Lymph nodes are mobile and nontender. Respiratory: Breathing comfortably  Skin: No facial/neck lesions or rash noted.  Procedures  Assessment: Benign submental lymphadenopathy  noted.  Plan: Discussed with patient as well as her mother that these are consistent with benign reactive lymphadenopathy.  No further therapy is needed however if they enlarge to 2 cm or greater would need to be rechecked.  Would not recommend antibiotics unless they became painful.  Radene Journey, MD

## 2020-07-10 ENCOUNTER — Ambulatory Visit: Payer: 59 | Admitting: Physician Assistant

## 2020-09-04 ENCOUNTER — Other Ambulatory Visit: Payer: Self-pay

## 2020-09-04 DIAGNOSIS — Z20822 Contact with and (suspected) exposure to covid-19: Secondary | ICD-10-CM

## 2020-09-05 LAB — SARS-COV-2, NAA 2 DAY TAT

## 2020-09-05 LAB — NOVEL CORONAVIRUS, NAA: SARS-CoV-2, NAA: DETECTED — AB

## 2020-09-06 ENCOUNTER — Telehealth: Payer: Self-pay | Admitting: Adult Health

## 2020-09-06 NOTE — Telephone Encounter (Signed)
Called to discuss with patient about COVID-19 symptoms and the use of one of the available treatments for those with mild to moderate Covid symptoms and at a high risk of hospitalization.  Pt appears to qualify for outpatient treatment due to co-morbid conditions and/or a member of an at-risk group in accordance with the FDA Emergency Use Authorization.    Symptom onset: unsure Vaccinated: not documented  Booster? Not documented Qualifiers: BMI greater than 25  Unable to reach pt - my chart message sent.   Scot Dock

## 2020-10-14 ENCOUNTER — Ambulatory Visit: Payer: Self-pay | Admitting: Internal Medicine

## 2020-12-25 ENCOUNTER — Ambulatory Visit: Payer: Self-pay | Admitting: Gastroenterology

## 2020-12-26 ENCOUNTER — Ambulatory Visit: Payer: Self-pay | Admitting: Internal Medicine

## 2020-12-27 ENCOUNTER — Other Ambulatory Visit: Payer: Self-pay

## 2020-12-27 ENCOUNTER — Ambulatory Visit (INDEPENDENT_AMBULATORY_CARE_PROVIDER_SITE_OTHER): Payer: Commercial Managed Care - PPO | Admitting: Internal Medicine

## 2020-12-27 ENCOUNTER — Encounter: Payer: Self-pay | Admitting: Internal Medicine

## 2020-12-27 VITALS — BP 132/84 | HR 106 | Temp 98.1°F | Resp 18 | Ht 67.0 in | Wt 316.4 lb

## 2020-12-27 DIAGNOSIS — Z0000 Encounter for general adult medical examination without abnormal findings: Secondary | ICD-10-CM

## 2020-12-27 DIAGNOSIS — D509 Iron deficiency anemia, unspecified: Secondary | ICD-10-CM

## 2020-12-27 DIAGNOSIS — R7303 Prediabetes: Secondary | ICD-10-CM | POA: Diagnosis not present

## 2020-12-27 DIAGNOSIS — E282 Polycystic ovarian syndrome: Secondary | ICD-10-CM

## 2020-12-27 DIAGNOSIS — Z0001 Encounter for general adult medical examination with abnormal findings: Secondary | ICD-10-CM

## 2020-12-27 LAB — COMPREHENSIVE METABOLIC PANEL
ALT: 10 U/L (ref 0–35)
AST: 14 U/L (ref 0–37)
Albumin: 4.2 g/dL (ref 3.5–5.2)
Alkaline Phosphatase: 53 U/L (ref 39–117)
BUN: 6 mg/dL (ref 6–23)
CO2: 30 mEq/L (ref 19–32)
Calcium: 9.5 mg/dL (ref 8.4–10.5)
Chloride: 102 mEq/L (ref 96–112)
Creatinine, Ser: 0.59 mg/dL (ref 0.40–1.20)
GFR: 116.19 mL/min (ref >60.00)
Glucose, Bld: 90 mg/dL (ref 70–99)
Potassium: 3.8 mEq/L (ref 3.5–5.1)
Sodium: 138 mEq/L (ref 135–145)
Total Bilirubin: 0.5 mg/dL (ref 0.2–1.2)
Total Protein: 7.7 g/dL (ref 6.0–8.3)

## 2020-12-27 LAB — CBC
HCT: 40 % (ref 36.0–46.0)
Hemoglobin: 13.3 g/dL (ref 12.0–15.0)
MCHC: 33.3 g/dL (ref 30.0–36.0)
MCV: 78.9 fl (ref 78.0–100.0)
Platelets: 305 10*3/uL (ref 150.0–400.0)
RBC: 5.08 Mil/uL (ref 3.87–5.11)
RDW: 14.4 % (ref 11.5–15.5)
WBC: 6.4 10*3/uL (ref 4.0–10.5)

## 2020-12-27 LAB — LIPID PANEL
Cholesterol: 121 mg/dL (ref 0–200)
HDL: 37 mg/dL — ABNORMAL LOW (ref >39.00)
LDL Cholesterol: 70 mg/dL (ref 0–99)
NonHDL: 83.58
Total CHOL/HDL Ratio: 3
Triglycerides: 68 mg/dL (ref 0.0–149.0)
VLDL: 13.6 mg/dL (ref 0.0–40.0)

## 2020-12-27 LAB — HEMOGLOBIN A1C: Hgb A1c MFr Bld: 5.9 % (ref 4.6–6.5)

## 2020-12-27 NOTE — Assessment & Plan Note (Signed)
Checking HgA1c today and adjust as needed.  

## 2020-12-27 NOTE — Assessment & Plan Note (Signed)
Weight is up and she is wanting to work on weight loss with diet and exercise. Counseling done today.

## 2020-12-27 NOTE — Progress Notes (Signed)
   Subjective:   Patient ID: Alejandra Figueroa, female    DOB: April 24, 1986, 35 y.o.   MRN: [id number]  HPI The patient is a new 35 YO female coming in for physical.   PMH, [place], social history reviewed and updated  Review of Systems  Constitutional: Negative.   HENT: Negative.   Eyes: Negative.   Respiratory: Negative for cough, chest tightness and shortness of breath.   Cardiovascular: Negative for chest pain, palpitations and leg swelling.  Gastrointestinal: Negative for abdominal distention, abdominal pain, constipation, diarrhea, nausea and vomiting.  Musculoskeletal: Negative.   Skin: Negative.   Neurological: Negative.   Psychiatric/Behavioral: Negative.     Objective:  Physical Exam Constitutional:      Appearance: She is well-developed. She is obese.  HENT:     Head: Normocephalic and atraumatic.  Cardiovascular:     Rate and Rhythm: Normal rate and regular rhythm.  Pulmonary:     Effort: Pulmonary effort is normal. No respiratory distress.     Breath sounds: Normal breath sounds. No wheezing or rales.  Abdominal:     General: Bowel sounds are normal. There is no distension.     Palpations: Abdomen is soft.     Tenderness: There is no abdominal tenderness. There is no rebound.  Musculoskeletal:     Cervical back: Normal range of motion.  Skin:    General: Skin is warm and dry.  Neurological:     Mental Status: She is alert and oriented to person, place, and time.     Coordination: Coordination normal.     Vitals:   12/27/20 1349  BP: 132/84  Pulse: (!) 106  Resp: 18  Temp: 98.1 F (36.7 C)  TempSrc: Oral  SpO2: 98%  Weight: (!) 316 lb 6.4 oz (143.5 kg)  Height: 5\' 7"  (1.702 m)    This visit occurred during the SARS-CoV-2 public health emergency.  Safety protocols were in place, including screening questions prior to the visit, additional usage of staff PPE, and extensive cleaning of exam room while observing appropriate contact time as indicated for  disinfecting solutions.   Assessment & Plan:

## 2020-12-27 NOTE — Patient Instructions (Addendum)
Think really hard about getting the covid-19 vaccine to help protect your health.  Health Maintenance, Female Adopting a healthy lifestyle and getting preventive care are important in promoting health and wellness. Ask your health care provider about:  The right schedule for you to have regular tests and exams.  Things you can do on your own to prevent diseases and keep yourself healthy. What should I know about diet, weight, and exercise? Eat a healthy diet  Eat a diet that includes plenty of vegetables, fruits, low-fat dairy products, and lean protein.  Do not eat a lot of foods that are high in solid fats, added sugars, or sodium.   Maintain a healthy weight Body mass index (BMI) is used to identify weight problems. It estimates body fat based on height and weight. Your health care provider can help determine your BMI and help you achieve or maintain a healthy weight. Get regular exercise Get regular exercise. This is one of the most important things you can do for your health. Most adults should:  Exercise for at least 150 minutes each week. The exercise should increase your heart rate and make you sweat (moderate-intensity exercise).  Do strengthening exercises at least twice a week. This is in addition to the moderate-intensity exercise.  Spend less time sitting. Even light physical activity can be beneficial. Watch cholesterol and blood lipids Have your blood tested for lipids and cholesterol at 36 years of age, then have this test every 5 years. Have your cholesterol levels checked more often if:  Your lipid or cholesterol levels are high.  You are older than 36 years of age.  You are at high risk for heart disease. What should I know about cancer screening? Depending on your health history and family history, you may need to have cancer screening at various ages. This may include screening for:  Breast cancer.  Cervical cancer.  Colorectal cancer.  Skin  cancer.  Lung cancer. What should I know about heart disease, diabetes, and high blood pressure? Blood pressure and heart disease  High blood pressure causes heart disease and increases the risk of stroke. This is more likely to develop in people who have high blood pressure readings, are of African descent, or are overweight.  Have your blood pressure checked: ? Every 3-5 years if you are 18-53 years of age. ? Every year if you are 46 years old or older. Diabetes Have regular diabetes screenings. This checks your fasting blood sugar level. Have the screening done:  Once every three years after age 67 if you are at a normal weight and have a low risk for diabetes.  More often and at a younger age if you are overweight or have a high risk for diabetes. What should I know about preventing infection? Hepatitis B If you have a higher risk for hepatitis B, you should be screened for this virus. Talk with your health care provider to find out if you are at risk for hepatitis B infection. Hepatitis C Testing is recommended for:  Everyone born from [birth year] through 1966.  Anyone with known risk factors for hepatitis C. Sexually transmitted infections (STIs)  Get screened for STIs, including gonorrhea and chlamydia, if: ? You are sexually active and are younger than 36 years of age. ? You are older than 37 years of age and your health care provider tells you that you are at risk for this type of infection. ? Your sexual activity has changed since you were last screened, and  you are at increased risk for chlamydia or gonorrhea. Ask your health care provider if you are at risk.  Ask your health care provider about whether you are at high risk for HIV. Your health care provider may recommend a prescription medicine to help prevent HIV infection. If you choose to take medicine to prevent HIV, you should first get tested for HIV. You should then be tested every 3 months for as long as you are taking  the medicine. Pregnancy  If you are about to stop having your period (premenopausal) and you may become pregnant, seek counseling before you get pregnant.  Take 400 to 800 micrograms (mcg) of folic acid every day if you become pregnant.  Ask for birth control (contraception) if you want to prevent pregnancy. Osteoporosis and menopause Osteoporosis is a disease in which the bones lose minerals and strength with aging. This can result in bone fractures. If you are 58 years old or older, or if you are at risk for osteoporosis and fractures, ask your health care provider if you should:  Be screened for bone loss.  Take a calcium or vitamin D supplement to lower your risk of fractures.  Be given hormone replacement therapy (HRT) to treat symptoms of menopause. Follow these instructions at home: Lifestyle  Do not use any products that contain nicotine or tobacco, such as cigarettes, e-cigarettes, and chewing tobacco. If you need help quitting, ask your health care provider.  Do not use street drugs.  Do not share needles.  Ask your health care provider for help if you need support or information about quitting drugs. Alcohol use  Do not drink alcohol if: ? Your health care provider tells you not to drink. ? You are pregnant, may be pregnant, or are planning to become pregnant.  If you drink alcohol: ? Limit how much you use to 0-1 drink a day. ? Limit intake if you are breastfeeding.  Be aware of how much alcohol is in your drink. In the [place] one drink equals one 12 oz bottle of beer (355 mL), one 5 oz glass of wine (148 mL), or one 1 oz glass of hard liquor (44 mL). General instructions  Schedule regular health, dental, and eye exams.  Stay current with your vaccines.  Tell your health care provider if: ? You often feel depressed. ? You have ever been abused or do not feel safe at home. Summary  Adopting a healthy lifestyle and getting preventive care are important in  promoting health and wellness.  Follow your health care provider's instructions about healthy diet, exercising, and getting tested or screened for diseases.  Follow your health care provider's instructions on monitoring your cholesterol and blood pressure. This information is not intended to replace advice given to you by your health care provider. Make sure you discuss any questions you have with your health care provider. Document Revised: 08/10/2018 Document Reviewed: 08/10/2018 Elsevier Patient Education  2021 [employer].

## 2020-12-27 NOTE — Assessment & Plan Note (Signed)
Flu shot yearly. Covid-19 counseled not done yet. Tetanus due. Colonoscopy due 2022. Mammogram with gyn, pap smear with gyn up to date. Counseled about sun safety and mole surveillance. Counseled about the dangers of distracted driving. Given 10 year screening recommendations.

## 2020-12-27 NOTE — Assessment & Plan Note (Signed)
Checking CBC to see if this is a current problem.

## 2020-12-27 NOTE — Assessment & Plan Note (Signed)
Seeing gyn who are managing this.

## 2021-04-01 ENCOUNTER — Other Ambulatory Visit: Payer: Self-pay | Admitting: Obstetrics & Gynecology

## 2021-04-01 ENCOUNTER — Ambulatory Visit
Admission: RE | Admit: 2021-04-01 | Discharge: 2021-04-01 | Disposition: A | Discharge status: Home / Self Care | Payer: Commercial Managed Care - PPO | Source: Ambulatory Visit | Attending: Obstetrics & Gynecology | Admitting: Obstetrics & Gynecology

## 2021-04-01 ENCOUNTER — Other Ambulatory Visit: Payer: Self-pay

## 2021-04-01 DIAGNOSIS — Z1231 Encounter for screening mammogram for malignant neoplasm of breast: Secondary | ICD-10-CM

## 2021-05-02 ENCOUNTER — Encounter: Payer: Self-pay | Admitting: Internal Medicine

## 2021-05-02 ENCOUNTER — Ambulatory Visit: Payer: Commercial Managed Care - PPO | Admitting: Internal Medicine

## 2021-05-02 ENCOUNTER — Other Ambulatory Visit: Payer: Self-pay

## 2021-05-02 VITALS — BP 126/74 | HR 107 | Resp 18 | Ht 67.0 in | Wt 286.4 lb

## 2021-05-02 DIAGNOSIS — F419 Anxiety disorder, unspecified: Secondary | ICD-10-CM | POA: Diagnosis not present

## 2021-05-02 MED ORDER — ALPRAZOLAM 0.25 MG PO TABS: 0.2500 mg | ORAL_TABLET | Freq: Two times a day (BID) | ORAL | 0 refills | Status: DC | PRN

## 2021-05-02 NOTE — Assessment & Plan Note (Signed)
She is down 30 pounds since last visit and has some redundant skin and some past scars. Offered referral to plastic surgery once at goal weight. OTC vitamin E cream to help the scar.

## 2021-05-02 NOTE — Progress Notes (Signed)
   Subjective:   Patient ID: Alejandra Figueroa, female    DOB: 10-Mar-1986, 35 y.o.   MRN: [id number]  HPI The patient is a 35 YO female coming in for concerns about anxiety and scar tissue on her stomach.   Review of Systems  Constitutional: Negative.   HENT: Negative.    Eyes: Negative.   Respiratory:  Negative for cough, chest tightness and shortness of breath.   Cardiovascular:  Negative for chest pain, palpitations and leg swelling.  Gastrointestinal:  Negative for abdominal distention, abdominal pain, constipation, diarrhea, nausea and vomiting.  Musculoskeletal: Negative.   Skin: Negative.   Neurological: Negative.   Psychiatric/Behavioral:  The patient is nervous/anxious.    Objective:  Physical Exam Constitutional:      Appearance: She is well-developed.  HENT:     Head: Normocephalic and atraumatic.  Cardiovascular:     Rate and Rhythm: Normal rate and regular rhythm.  Pulmonary:     Effort: Pulmonary effort is normal. No respiratory distress.     Breath sounds: Normal breath sounds. No wheezing or rales.  Abdominal:     General: Bowel sounds are normal. There is no distension.     Palpations: Abdomen is soft.     Tenderness: There is no abdominal tenderness. There is no rebound.     Comments: Scars on the stomach   Musculoskeletal:     Cervical back: Normal range of motion.  Skin:    General: Skin is warm and dry.  Neurological:     Mental Status: She is alert and oriented to person, place, and time.     Coordination: Coordination normal.    Vitals:   05/02/21 1043  BP: 126/74  Pulse: (!) 107  Resp: 18  SpO2: 97%  Weight: 286 lb 6.4 oz (129.9 kg)  Height: '5\' 7"'$  (1.702 m)    This visit occurred during the SARS-CoV-2 public health emergency.  Safety protocols were in place, including screening questions prior to the visit, additional usage of staff PPE, and extensive cleaning of exam room while observing appropriate contact time as indicated for  disinfecting solutions.   Assessment & Plan:

## 2021-05-02 NOTE — Assessment & Plan Note (Signed)
Rx alprazolam for anxiety attacks and referral for counseling.

## 2021-05-02 NOTE — Patient Instructions (Addendum)
The skin on your stomach is normal.  We will get you in touch with a counselor to help with the anxiety long term.  We have sent in low dose xanax to help if you do get anxiety episodes.

## 2021-09-16 ENCOUNTER — Ambulatory Visit (INDEPENDENT_AMBULATORY_CARE_PROVIDER_SITE_OTHER): Payer: Commercial Managed Care - PPO | Admitting: Internal Medicine

## 2021-09-16 ENCOUNTER — Other Ambulatory Visit: Payer: Self-pay

## 2021-09-16 ENCOUNTER — Encounter: Payer: Self-pay | Admitting: Internal Medicine

## 2021-09-16 VITALS — BP 134/84 | HR 79 | Temp 98.3°F | Ht 67.0 in | Wt 240.0 lb

## 2021-09-16 DIAGNOSIS — R300 Dysuria: Secondary | ICD-10-CM | POA: Insufficient documentation

## 2021-09-16 NOTE — Progress Notes (Signed)
Subjective:  Patient ID: Alejandra Figueroa, female    DOB: August 08, 1986  Age: 36 y.o. MRN: [id number]  CC: Urinary Tract Infection  This visit occurred during the SARS-CoV-2 public health emergency.  Safety protocols were in place, including screening questions prior to the visit, additional usage of staff PPE, and extensive cleaning of exam room while observing appropriate contact time as indicated for disinfecting solutions.    HPI CAROLYNA Figueroa presents for f/up -  She did a virtual visit a month ago and was treated for UTI with antibiotics.  She complains that her symptoms have returned over the last few days with pressure in her bladder with dysuria and polyuria.  She denies nausea, vomiting, fever, chills, hematuria, vaginal discharge, or vaginal bleeding.  Outpatient Medications Prior to Visit  Medication Sig Dispense Refill   ALPRAZolam (XANAX) 0.25 MG tablet Take 1 tablet (0.25 mg total) by mouth 2 (two) times daily as needed for anxiety. 20 tablet 0   medroxyPROGESTERone (PROVERA) 10 MG tablet Take 1 tablet by mouth. For 12 days until the next month     No facility-administered medications prior to visit.    ROS Review of Systems  Constitutional:  Negative for diaphoresis, fatigue and fever.  HENT: Negative.    Eyes: Negative.   Respiratory:  Negative for cough, chest tightness, shortness of breath and wheezing.   Cardiovascular:  Negative for chest pain, palpitations and leg swelling.  Gastrointestinal:  Negative for abdominal pain, diarrhea, nausea and vomiting.  Endocrine: Negative.  Negative for polydipsia, polyphagia and polyuria.  Genitourinary:  Positive for dysuria and frequency. Negative for decreased urine volume, difficulty urinating, flank pain, hematuria, urgency, vaginal bleeding and vaginal discharge.  Musculoskeletal: Negative.   Skin: Negative.  Negative for rash.  Neurological: Negative.  Negative for dizziness, weakness, light-headedness and  numbness.  Hematological:  Negative for adenopathy. Does not bruise/bleed easily.  Psychiatric/Behavioral: Negative.     Objective:  BP 134/84 (BP Location: Left Arm, Patient Position: Sitting, Cuff Size: Large)    Pulse 79    Temp 98.3 F (36.8 C) (Oral)    Ht 5\' 7"  (1.702 m)    Wt 240 lb (108.9 kg)    SpO2 99%    BMI 37.59 kg/m   BP Readings from Last 3 Encounters:  09/16/21 134/84  05/02/21 126/74  12/27/20 132/84    Wt Readings from Last 3 Encounters:  09/16/21 240 lb (108.9 kg)  05/02/21 286 lb 6.4 oz (129.9 kg)  12/27/20 (!) 316 lb 6.4 oz (143.5 kg)    Physical Exam Vitals reviewed.  HENT:     Nose: Nose normal.     Mouth/Throat:     Mouth: Mucous membranes are moist.  Eyes:     General: No scleral icterus.    Conjunctiva/sclera: Conjunctivae normal.  Cardiovascular:     Rate and Rhythm: Normal rate and regular rhythm.     Heart sounds: No murmur heard. Pulmonary:     Effort: Pulmonary effort is normal.     Breath sounds: No stridor. No wheezing, rhonchi or rales.  Abdominal:     General: Abdomen is flat.     Palpations: There is no mass.     Tenderness: There is no abdominal tenderness. There is no guarding.     Hernia: No hernia is present.  Musculoskeletal:     Cervical back: Neck supple.  Lymphadenopathy:     Cervical: No cervical adenopathy.  Skin:    General: Skin is warm and  dry.  Neurological:     General: No focal deficit present.     Mental Status: She is alert.  Psychiatric:        Mood and Affect: Mood normal.        Behavior: Behavior normal.    Lab Results  Component Value Date   WBC 6.4 12/27/2020   HGB 13.3 12/27/2020   HCT 40.0 12/27/2020   PLT 305.0 12/27/2020   GLUCOSE 90 12/27/2020   CHOL 121 12/27/2020   TRIG 68.0 12/27/2020   HDL 37.00 (L) 12/27/2020   LDLCALC 70 12/27/2020   ALT 10 12/27/2020   AST 14 12/27/2020   NA 138 12/27/2020   K 3.8 12/27/2020   CL 102 12/27/2020   CREATININE 0.59 12/27/2020   BUN 6 12/27/2020    CO2 30 12/27/2020   TSH 2.625 12/09/2012   HGBA1C 5.9 12/27/2020    MM 3D SCREEN BREAST UNI RIGHT  Result Date: 04/04/2021 CLINICAL DATA:  Screening. EXAM: DIGITAL SCREENING UNILATERAL RIGHT MAMMOGRAM WITH CAD AND TOMOSYNTHESIS TECHNIQUE: Right screening digital craniocaudal and mediolateral oblique mammograms were obtained. Right screening digital breast tomosynthesis was performed. The images were evaluated with computer-aided detection. COMPARISON:  Previous exam(s). ACR Breast Density Category a: The breast tissue is almost entirely fatty. FINDINGS: The patient has had a left mastectomy. There are no findings suspicious for malignancy. IMPRESSION: No mammographic evidence of malignancy. A result letter of this screening mammogram will be mailed directly to the patient. RECOMMENDATION: Screening mammogram in one year.  (Code:SM-R-40M) BI-RADS CATEGORY  1: Negative. Electronically Signed   By: Kristopher Oppenheim M.D.   On: 04/04/2021 16:05    Assessment & Plan:   Callan was seen today for urinary tract infection.  Diagnoses and all orders for this visit:  Dysuria- The urine culture was negative. -     Urinalysis, Routine w reflex microscopic; Future -     CULTURE, URINE COMPREHENSIVE; Future -     CULTURE, URINE COMPREHENSIVE -     Urinalysis, Routine w reflex microscopic   I am having Alejandra Figueroa maintain her medroxyPROGESTERone and ALPRAZolam.  No orders of the defined types were placed in this encounter.    Follow-up: No follow-ups on file.  Scarlette Calico, MD

## 2021-09-17 LAB — URINALYSIS, ROUTINE W REFLEX MICROSCOPIC
Bilirubin Urine: NEGATIVE
Hgb urine dipstick: NEGATIVE
Ketones, ur: NEGATIVE
Leukocytes,Ua: NEGATIVE
Nitrite: NEGATIVE
RBC / HPF: NONE SEEN (ref 0–?)
Specific Gravity, Urine: 1.01 (ref 1.000–1.030)
Total Protein, Urine: NEGATIVE
Urine Glucose: NEGATIVE
Urobilinogen, UA: 0.2 (ref 0.0–1.0)
pH: 6 (ref 5.0–8.0)

## 2021-09-19 ENCOUNTER — Ambulatory Visit: Payer: Commercial Managed Care - PPO | Admitting: Internal Medicine

## 2021-09-19 ENCOUNTER — Encounter: Payer: Self-pay | Admitting: Internal Medicine

## 2021-09-19 LAB — CULTURE, URINE COMPREHENSIVE

## 2021-09-22 ENCOUNTER — Encounter: Payer: Self-pay | Admitting: Internal Medicine

## 2021-10-08 ENCOUNTER — Other Ambulatory Visit: Payer: Self-pay | Admitting: Obstetrics & Gynecology

## 2021-10-08 DIAGNOSIS — D251 Intramural leiomyoma of uterus: Secondary | ICD-10-CM

## 2021-10-08 DIAGNOSIS — D252 Subserosal leiomyoma of uterus: Secondary | ICD-10-CM

## 2021-10-10 ENCOUNTER — Ambulatory Visit
Admission: RE | Admit: 2021-10-10 | Discharge: 2021-10-10 | Disposition: A | Discharge status: Home / Self Care | Payer: Commercial Managed Care - PPO | Source: Ambulatory Visit | Attending: Obstetrics & Gynecology | Admitting: Obstetrics & Gynecology

## 2021-10-10 DIAGNOSIS — D251 Intramural leiomyoma of uterus: Secondary | ICD-10-CM

## 2022-03-02 ENCOUNTER — Encounter: Payer: Self-pay | Admitting: Internal Medicine

## 2022-03-02 ENCOUNTER — Ambulatory Visit (INDEPENDENT_AMBULATORY_CARE_PROVIDER_SITE_OTHER): Payer: 59 | Admitting: Internal Medicine

## 2022-03-02 VITALS — BP 116/70 | HR 82 | Resp 18 | Ht 67.0 in | Wt 231.2 lb

## 2022-03-02 DIAGNOSIS — D573 Sickle-cell trait: Secondary | ICD-10-CM

## 2022-03-02 DIAGNOSIS — D509 Iron deficiency anemia, unspecified: Secondary | ICD-10-CM

## 2022-03-02 DIAGNOSIS — Z0000 Encounter for general adult medical examination without abnormal findings: Secondary | ICD-10-CM | POA: Diagnosis not present

## 2022-03-02 DIAGNOSIS — R7303 Prediabetes: Secondary | ICD-10-CM | POA: Diagnosis not present

## 2022-03-02 DIAGNOSIS — F419 Anxiety disorder, unspecified: Secondary | ICD-10-CM

## 2022-03-02 DIAGNOSIS — Z0001 Encounter for general adult medical examination with abnormal findings: Secondary | ICD-10-CM

## 2022-03-02 LAB — CBC
HCT: 40.7 % (ref 36.0–46.0)
Hemoglobin: 13.5 g/dL (ref 12.0–15.0)
MCHC: 33.1 g/dL (ref 30.0–36.0)
MCV: 82.2 fl (ref 78.0–100.0)
Platelets: 207 10*3/uL (ref 150.0–400.0)
RBC: 4.95 Mil/uL (ref 3.87–5.11)
RDW: 14.4 % (ref 11.5–15.5)
WBC: 4.1 10*3/uL (ref 4.0–10.5)

## 2022-03-02 LAB — COMPREHENSIVE METABOLIC PANEL
ALT: 9 U/L (ref 0–35)
AST: 15 U/L (ref 0–37)
Albumin: 4.2 g/dL (ref 3.5–5.2)
Alkaline Phosphatase: 41 U/L (ref 39–117)
BUN: 7 mg/dL (ref 6–23)
CO2: 28 mEq/L (ref 19–32)
Calcium: 9.8 mg/dL (ref 8.4–10.5)
Chloride: 103 mEq/L (ref 96–112)
Creatinine, Ser: 0.52 mg/dL (ref 0.40–1.20)
GFR: 118.79 mL/min (ref >60.00)
Glucose, Bld: 90 mg/dL (ref 70–99)
Potassium: 4 mEq/L (ref 3.5–5.1)
Sodium: 139 mEq/L (ref 135–145)
Total Bilirubin: 0.6 mg/dL (ref 0.2–1.2)
Total Protein: 7.1 g/dL (ref 6.0–8.3)

## 2022-03-02 LAB — LIPID PANEL
Cholesterol: 138 mg/dL (ref 0–200)
HDL: 47.5 mg/dL (ref >39.00)
LDL Cholesterol: 81 mg/dL (ref 0–99)
NonHDL: 90.43
Total CHOL/HDL Ratio: 3
Triglycerides: 47 mg/dL (ref 0.0–149.0)
VLDL: 9.4 mg/dL (ref 0.0–40.0)

## 2022-03-02 LAB — HEMOGLOBIN A1C: Hgb A1c MFr Bld: 5.3 % (ref 4.6–6.5)

## 2022-03-02 MED ORDER — ALPRAZOLAM 0.25 MG PO TABS: 0.2500 mg | ORAL_TABLET | Freq: Two times a day (BID) | ORAL | 1 refills | Status: DC | PRN

## 2022-03-02 NOTE — Progress Notes (Unsigned)
   Subjective:   Patient ID: Alejandra Figueroa, female    DOB: Oct 13, 1985, 36 y.o.   MRN: [id number]  HPI The patient is here for physical.  PMH, [facility], social history reviewed and updated  Review of Systems  Constitutional: Negative.   HENT: Negative.    Eyes: Negative.   Respiratory:  Negative for cough, chest tightness and shortness of breath.   Cardiovascular:  Negative for chest pain, palpitations and leg swelling.  Gastrointestinal:  Negative for abdominal distention, abdominal pain, constipation, diarrhea, nausea and vomiting.  Musculoskeletal: Negative.   Skin: Negative.   Neurological: Negative.   Psychiatric/Behavioral: Negative.      Objective:  Physical Exam Constitutional:      Appearance: She is well-developed.  HENT:     Head: Normocephalic and atraumatic.  Cardiovascular:     Rate and Rhythm: Normal rate and regular rhythm.  Pulmonary:     Effort: Pulmonary effort is normal. No respiratory distress.     Breath sounds: Normal breath sounds. No wheezing or rales.  Abdominal:     General: Bowel sounds are normal. There is no distension.     Palpations: Abdomen is soft.     Tenderness: There is no abdominal tenderness. There is no rebound.  Musculoskeletal:     Cervical back: Normal range of motion.  Skin:    General: Skin is warm and dry.  Neurological:     Mental Status: She is alert and oriented to person, place, and time.     Coordination: Coordination normal.     Vitals:   03/02/22 1300  BP: 116/70  Pulse: 82  Resp: 18  SpO2: 99%  Weight: 231 lb 3.2 oz (104.9 kg)  Height: '5\' 7"'$  (1.702 m)    Assessment & Plan:

## 2022-03-05 NOTE — Assessment & Plan Note (Signed)
Checking HgA1c and adjust as needed. Checking lipid panel as well.

## 2022-03-05 NOTE — Assessment & Plan Note (Signed)
Flu shot yearly. Covid-19 counseled. Tetanus up to date. Colonoscopy up to date. , pap smear up to date. Counseled about sun safety and mole surveillance. Counseled about the dangers of distracted driving. Given 10 year screening recommendations.

## 2022-03-05 NOTE — Assessment & Plan Note (Signed)
Rare usage #30 since September 2022. Refilled today and she knows to use rarely. She is using coping skills as well to manage and does not feel daily medication is needed at this time.

## 2022-03-05 NOTE — Assessment & Plan Note (Signed)
Checking CBC and adjust as needed.  

## 2022-03-05 NOTE — Assessment & Plan Note (Signed)
No symptoms continue to monitor yearly CBC.

## 2022-03-05 NOTE — Assessment & Plan Note (Signed)
BMI 36 but complicated by anemia, pre-diabetes and PCOS.

## 2022-05-05 ENCOUNTER — Other Ambulatory Visit: Payer: Self-pay | Admitting: Obstetrics & Gynecology

## 2022-05-05 DIAGNOSIS — Z1231 Encounter for screening mammogram for malignant neoplasm of breast: Secondary | ICD-10-CM

## 2022-05-13 ENCOUNTER — Ambulatory Visit
Admission: RE | Admit: 2022-05-13 | Discharge: 2022-05-13 | Disposition: A | Discharge status: Home / Self Care | Payer: 59 | Source: Ambulatory Visit | Attending: Obstetrics & Gynecology | Admitting: Obstetrics & Gynecology

## 2022-05-13 DIAGNOSIS — Z1231 Encounter for screening mammogram for malignant neoplasm of breast: Secondary | ICD-10-CM

## 2022-06-24 ENCOUNTER — Telehealth: Payer: Self-pay

## 2022-06-24 NOTE — Telephone Encounter (Signed)
Error

## 2022-06-29 ENCOUNTER — Encounter: Payer: Self-pay | Admitting: Internal Medicine

## 2022-06-29 ENCOUNTER — Ambulatory Visit (INDEPENDENT_AMBULATORY_CARE_PROVIDER_SITE_OTHER): Payer: 59 | Admitting: Internal Medicine

## 2022-06-29 DIAGNOSIS — G4452 New daily persistent headache (NDPH): Secondary | ICD-10-CM

## 2022-06-29 DIAGNOSIS — R519 Headache, unspecified: Secondary | ICD-10-CM | POA: Insufficient documentation

## 2022-06-29 NOTE — Assessment & Plan Note (Signed)
No red flag signs. Could be sinus related or minor viral illness. Advised to take allergy medicine like zyrtec daily and let us know if no improvement in 3-4 days. No change in caffeine, sleep, stress.

## 2022-06-29 NOTE — Progress Notes (Signed)
   Subjective:   Patient ID: Alejandra Figueroa, female    DOB: 14-Nov-1985, 36 y.o.   MRN: [id number]  HPI The patient is a 36 YO female coming in for headaches.  Review of Systems  Constitutional: Negative.   HENT:  Positive for sinus pressure.   Eyes: Negative.   Respiratory:  Negative for cough, chest tightness and shortness of breath.   Cardiovascular:  Negative for chest pain, palpitations and leg swelling.  Gastrointestinal:  Negative for abdominal distention, abdominal pain, constipation, diarrhea, nausea and vomiting.  Musculoskeletal: Negative.   Skin: Negative.   Neurological:  Positive for headaches.  Psychiatric/Behavioral: Negative.      Objective:  Physical Exam Constitutional:      Appearance: She is well-developed.  HENT:     Head: Normocephalic and atraumatic.  Cardiovascular:     Rate and Rhythm: Normal rate and regular rhythm.  Pulmonary:     Effort: Pulmonary effort is normal. No respiratory distress.     Breath sounds: Normal breath sounds. No wheezing or rales.  Abdominal:     General: Bowel sounds are normal. There is no distension.     Palpations: Abdomen is soft.     Tenderness: There is no abdominal tenderness. There is no rebound.  Musculoskeletal:     Cervical back: Normal range of motion.  Skin:    General: Skin is warm and dry.  Neurological:     Mental Status: She is alert and oriented to person, place, and time.     Coordination: Coordination normal.     Vitals:   06/29/22 1329  BP: 130/60  Pulse: 73  Temp: 98 F (36.7 C)  TempSrc: Oral  SpO2: 99%  Weight: 232 lb (105.2 kg)  Height: '5\' 7"'$  (1.702 m)    Assessment & Plan:  Visit time 15 minutes in face to face communication with patient and coordination of care, additional 5 minutes spent in record review, coordination or care, ordering tests, communicating/referring to other healthcare professionals, documenting in medical records all on the same day of the visit for total time  20 minutes spent on the visit.

## 2022-06-29 NOTE — Patient Instructions (Signed)
Try taking the allergy medicine for 3-4 days to see if this helps.

## 2022-09-21 ENCOUNTER — Telehealth: Payer: Self-pay | Admitting: Internal Medicine

## 2022-09-21 DIAGNOSIS — Z86010 Personal history of colonic polyps: Secondary | ICD-10-CM

## 2022-09-21 NOTE — Telephone Encounter (Signed)
Patient called and said she wants to get a colonoscopy done and she said Dr Sharlet Salina told her to call back and request it. She just wants done within [place]. Call back if needed is [phone]

## 2022-09-24 ENCOUNTER — Encounter: Payer: Self-pay | Admitting: Gastroenterology

## 2022-09-24 NOTE — Telephone Encounter (Signed)
Referral done but she will need to submit records from past colonoscopy likely before they will schedule her.

## 2022-10-15 ENCOUNTER — Telehealth: Payer: Self-pay | Admitting: Internal Medicine

## 2022-10-15 NOTE — Telephone Encounter (Signed)
Spoke with patient and advised to unfortunate circumstances with our procedure room we may not be bale to remove the cyst for her and advised that he be seen at urgent care to get it lanced and looked out before coming in office. Patient agreed and understood.

## 2022-10-15 NOTE — Telephone Encounter (Signed)
Patient called and said she has a cyst under her armpit. She would like a referral to a dermatologist for them to take a look at it. Best callback number is [phone].

## 2022-10-20 ENCOUNTER — Ambulatory Visit: Payer: 59 | Admitting: Emergency Medicine

## 2022-11-06 ENCOUNTER — Encounter: Payer: Self-pay | Admitting: Gastroenterology

## 2022-11-06 ENCOUNTER — Ambulatory Visit (AMBULATORY_SURGERY_CENTER): Payer: 59

## 2022-11-06 VITALS — Ht 66.0 in | Wt 240.0 lb

## 2022-11-06 DIAGNOSIS — Z86010 Personal history of colonic polyps: Secondary | ICD-10-CM

## 2022-11-06 MED ORDER — NA SULFATE-K SULFATE-MG SULF 17.5-3.13-1.6 GM/177ML PO SOLN: 1.0000 | Freq: Once | ORAL | 0 refills | Status: AC

## 2022-11-06 NOTE — Progress Notes (Signed)

## 2022-12-03 ENCOUNTER — Encounter: Payer: Self-pay | Admitting: Certified Registered Nurse Anesthetist

## 2022-12-04 ENCOUNTER — Ambulatory Visit (AMBULATORY_SURGERY_CENTER): Payer: 59 | Admitting: Gastroenterology

## 2022-12-04 ENCOUNTER — Encounter: Payer: Self-pay | Admitting: Gastroenterology

## 2022-12-04 VITALS — BP 119/86 | HR 53 | Temp 98.7°F | Resp 13 | Ht 67.0 in | Wt 248.4 lb

## 2022-12-04 DIAGNOSIS — Z86010 Personal history of colonic polyps: Secondary | ICD-10-CM | POA: Diagnosis not present

## 2022-12-04 DIAGNOSIS — K640 First degree hemorrhoids: Secondary | ICD-10-CM

## 2022-12-04 DIAGNOSIS — D124 Benign neoplasm of descending colon: Secondary | ICD-10-CM | POA: Diagnosis not present

## 2022-12-04 DIAGNOSIS — D128 Benign neoplasm of rectum: Secondary | ICD-10-CM

## 2022-12-04 DIAGNOSIS — K5140 Inflammatory polyps of colon without complications: Secondary | ICD-10-CM | POA: Diagnosis not present

## 2022-12-04 DIAGNOSIS — D125 Benign neoplasm of sigmoid colon: Secondary | ICD-10-CM | POA: Diagnosis not present

## 2022-12-04 DIAGNOSIS — Z09 Encounter for follow-up examination after completed treatment for conditions other than malignant neoplasm: Secondary | ICD-10-CM

## 2022-12-04 DIAGNOSIS — K621 Rectal polyp: Secondary | ICD-10-CM | POA: Diagnosis not present

## 2022-12-04 MED ORDER — SODIUM CHLORIDE 0.9 % IV SOLN: 500.0000 mL | Freq: Once | INTRAVENOUS | Status: DC

## 2022-12-04 NOTE — Progress Notes (Signed)
Pt's states no medical or surgical changes since previsit or office visit.   Per pt- ok to take BP on left side; she states she has had blood draws and BP's on that side previously

## 2022-12-04 NOTE — Patient Instructions (Addendum)
   Handouts on polyps & hemorrhoids given to you today  Await pathology results on polyps removed    YOU HAD AN ENDOSCOPIC PROCEDURE TODAY AT THE [facility]:   Refer to the procedure report that was given to you for any specific questions about what was found during the examination.  If the procedure report does not answer your questions, please call your gastroenterologist to clarify.  If you requested that your care partner not be given the details of your procedure findings, then the procedure report has been included in a sealed envelope for you to review at your convenience later.  YOU SHOULD EXPECT: Some feelings of bloating in the abdomen. Passage of more gas than usual.  Walking can help get rid of the air that was put into your GI tract during the procedure and reduce the bloating. If you had a lower endoscopy (such as a colonoscopy or flexible sigmoidoscopy) you may notice spotting of blood in your stool or on the toilet paper. If you underwent a bowel prep for your procedure, you may not have a normal bowel movement for a few days.  Please Note:  You might notice some irritation and congestion in your nose or some drainage.  This is from the oxygen used during your procedure.  There is no need for concern and it should clear up in a day or so.  SYMPTOMS TO REPORT IMMEDIATELY:  Following lower endoscopy (colonoscopy or flexible sigmoidoscopy):  Excessive amounts of blood in the stool  Significant tenderness or worsening of abdominal pains  Swelling of the abdomen that is new, acute  Fever of 100F or higher   For urgent or emergent issues, a gastroenterologist can be reached at any hour by calling [phone]. Do not use MyChart messaging for urgent concerns.    DIET:  We do recommend a small meal at first, but then you may proceed to your regular diet.  Drink plenty of fluids but you should avoid alcoholic beverages for 24 hours.  ACTIVITY:  You should plan to  take it easy for the rest of today and you should NOT DRIVE or use heavy machinery until tomorrow (because of the sedation medicines used during the test).    FOLLOW UP: Our staff will call the number listed on your records the next business day following your procedure.  We will call around 7:15- 8:00 am to check on you and address any questions or concerns that you may have regarding the information given to you following your procedure. If we do not reach you, we will leave a message.     If any biopsies were taken you will be contacted by phone or by letter within the next 1-3 weeks.  Please call us at [phone] if you have not heard about the biopsies in 3 weeks.    SIGNATURES/CONFIDENTIALITY: You and/or your care partner have signed paperwork which will be entered into your electronic medical record.  These signatures attest to the fact that that the information above on your After Visit Summary has been reviewed and is understood.  Full responsibility of the confidentiality of this discharge information lies with you and/or your care-partner. 

## 2022-12-04 NOTE — Progress Notes (Signed)
Called to room to assist during endoscopic procedure.  Patient ID and intended procedure confirmed with present staff. Received instructions for my participation in the procedure from the performing physician.  

## 2022-12-04 NOTE — Progress Notes (Signed)
GASTROENTEROLOGY PROCEDURE H&P NOTE   Primary Care Physician: Myrlene Brokerrawford, Elizabeth A, MD    Reason for Procedure:  Colon Cancer screening, colon polyp surveillance  Plan:    Colonoscopy  Patient is appropriate for endoscopic procedure(s) in the ambulatory ([facility]) setting.  The nature of the procedure, as well as the risks, benefits, and alternatives were carefully and thoroughly reviewed with the patient. Ample time for discussion and questions allowed. The patient understood, was satisfied, and agreed to proceed.     HPI: Alejandra Figueroa is a 37 y.o. female who presents for colonoscopy for ongoing colon polyp surveillance and Colon Cancer screening.  No active GI symptoms.   Last colonoscopy was 08/2019 and notable for an 18 mm hamartomatous polyp removed from the sigmoid colon, ascending colon SSP, and benign descending colon polyps.  Was told to repeat in 3 years.  Mother with colon polyps, but no known family history of colon cancer.  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   Colon polyps    PONV (postoperative nausea and vomiting)    Seasonal allergies     Past Surgical History:  Procedure Laterality Date   BREAST RECONSTRUCTION  2004   cancer   COLONOSCOPY     COLONOSCOPY WITH PROPOFOL N/A 08/22/2019   Procedure: COLONOSCOPY WITH PROPOFOL;  Surgeon: Jeani HawkingHung, Patrick, MD;  Location: [facility];  Service: Endoscopy;  Laterality: N/A;   COSMETIC SURGERY     CYSTECTOMY     HEMOSTASIS CLIP PLACEMENT  08/22/2019   Procedure: HEMOSTASIS CLIP PLACEMENT;  Surgeon: Jeani HawkingHung, Patrick, MD;  Location: [facility];  Service: Endoscopy;;   HYSTEROSCOPY WITH D & C N/A 01/18/2013   Procedure: DILATATION AND CURETTAGE /HYSTEROSCOPY, diagnostic;  Surgeon: Antionette CharLisa Jackson-Moore, MD;  Location: [facility];  Service: Gynecology;  Laterality: N/A;  diagnostic   MASTECTOMY Left    MYOMECTOMY     OVARY SURGERY      POLYPECTOMY  08/22/2019   Procedure: POLYPECTOMY;  Surgeon: Jeani HawkingHung, Patrick, MD;  Location: [facility];  Service: Endoscopy;;   REDUCTION MAMMAPLASTY Right    WISDOM TOOTH EXTRACTION      Prior to Admission medications   Medication Sig Start Date End Date Taking? Authorizing Provider  ALPRAZolam (XANAX) 0.25 MG tablet Take 1 tablet (0.25 mg total) by mouth 2 (two) times daily as needed for anxiety. 03/02/22  Yes Myrlene Brokerrawford, Elizabeth A, MD  medroxyPROGESTERone (PROVERA) 10 MG tablet Take 1 tablet by mouth. For 12 days until the next month 04/10/20  Yes [provider]  cyclobenzaprine (FLEXERIL) 5 MG tablet Take 5 mg by mouth 2 (two) times daily.    [provider]  diazepam (VALIUM) 10 MG tablet Take 10 mg by mouth every 6 (six) hours as needed. 12/02/16   [provider]  hydrOXYzine (ATARAX) 25 MG tablet Take 25 mg by mouth 4 (four) times daily as needed. 01/21/22   [provider]    Current Outpatient Medications  Medication Sig Dispense Refill   ALPRAZolam (XANAX) 0.25 MG tablet Take 1 tablet (0.25 mg total) by mouth 2 (two) times daily as needed for anxiety. 20 tablet 1   medroxyPROGESTERone (PROVERA) 10 MG tablet Take 1 tablet by mouth. For 12 days until the next month     cyclobenzaprine (FLEXERIL) 5 MG tablet Take 5 mg by mouth 2 (two) times daily.     diazepam (VALIUM)  10 MG tablet Take 10 mg by mouth every 6 (six) hours as needed.     hydrOXYzine (ATARAX) 25 MG tablet Take 25 mg by mouth 4 (four) times daily as needed.     Current Facility-Administered Medications  Medication Dose Route Frequency Provider Last Rate Last Admin   0.9 %  sodium chloride infusion  500 mL Intravenous Once Albin Duckett V, DO        Allergies as of 12/04/2022 - Review Complete 12/04/2022  Allergen Reaction Noted   Other Swelling 01/18/2018   Morphine and related Itching and Nausea And Vomiting 09/20/2011   Nystatin Swelling 10/22/2014   Peanut-containing drug  products  09/20/2011    Family History  Problem Relation Age of Onset   Colon polyps Mother    Peptic Ulcer Mother    Stomach cancer Father 66       deceased at 83   Prostate cancer Father    Bone cancer Maternal Grandmother    Breast cancer Maternal Great-grandmother    Esophageal cancer Neg Hx    Rectal cancer Neg Hx    Colon cancer Neg Hx     Social History   Socioeconomic History   Marital status: Married    Spouse name: Romond Buehrer   Number of children: 0   Years of education: Not on file   Highest education level: Not on file  Occupational History   Occupation: Unemployed  Tobacco Use   Smoking status: Never   Smokeless tobacco: Never  Vaping Use   Vaping Use: Never used  Substance and Sexual Activity   Alcohol use: Yes    Comment: occasional once every 6 months   Drug use: No   Sexual activity: Yes    Partners: Male    Birth control/protection: Pill    Comment: not since surgery  Other Topics Concern   Not on file  Social History Narrative   Not on file   Social Determinants of Health   Financial Resource Strain: Not on file  Food Insecurity: Not on file  Transportation Needs: Not on file  Physical Activity: Not on file  Stress: Not on file  Social Connections: Not on file  Intimate Partner Violence: Not on file    Physical Exam: Vital signs in last 24 hours: @BP  134/74   Pulse 84   Temp 98.7 F (37.1 C)   Ht 5\' 7"  (1.702 m)   Wt 248 lb 6.4 oz (112.7 kg)   LMP 11/14/2022 (Approximate)   SpO2 100%   BMI 38.90 kg/m  GEN: NAD EYE: Sclerae anicteric ENT: MMM CV: Non-tachycardic Pulm: CTA b/l GI: Soft, NT/ND NEURO:  Alert & Oriented x 3   Doristine Locks, DO [facility]   12/04/2022 10:30 AM

## 2022-12-04 NOTE — Progress Notes (Signed)
Report given to PACU, vss 

## 2022-12-04 NOTE — Op Note (Signed)
[facility] Patient Name: Alejandra Figueroa Procedure Date: 12/04/2022 10:28 AM MRN: [id number] Endoscopist: Doristine Locks , MD, [id number] Age: 37 Referring MD:  Date of Birth: February 22, 1986 Gender: Female Account #: [id number] Procedure:                Colonoscopy Indications:              High risk colon cancer surveillance: Personal                            history of sessile serrated colon polyp (less than                            10 mm in size) with no dysplasia                           Last colonoscopy was 08/2019 and notable for an 18                            mm hamartomatous polyp removed from the sigmoid                            colon, ascending colon SSP, and benign descending                            colon polyps. Was told to repeat in 3 years. Mother                            with colon polyps, but no known family history of                            colon cancer. Medicines:                Monitored Anesthesia Care Procedure:                Pre-Anesthesia Assessment:                           - Prior to the procedure, a History and Physical                            was performed, and patient medications and                            allergies were reviewed. The patient's tolerance of                            previous anesthesia was also reviewed. The risks                            and benefits of the procedure and the sedation                            options and risks were discussed with the patient.  All questions were answered, and informed consent                            was obtained. Prior Anticoagulants: The patient has                            taken no anticoagulant or antiplatelet agents. ASA                            Grade Assessment: II - A patient with mild systemic                            disease. After reviewing the risks and benefits,                            the patient was deemed in  satisfactory condition to                            undergo the procedure.                           After obtaining informed consent, the colonoscope                            was passed under direct vision. Throughout the                            procedure, the patient's blood pressure, pulse, and                            oxygen saturations were monitored continuously. The                            Olympus CF-HQ190L [id number]) Colonoscope was                            introduced through the anus and advanced to the the                            cecum, identified by appendiceal orifice and                            ileocecal valve. The colonoscopy was performed                            without difficulty. The patient tolerated the                            procedure well. The quality of the bowel                            preparation was good. The ileocecal valve,  appendiceal orifice, and rectum were photographed. Scope In: 10:38:45 AM Scope Out: 10:58:41 AM Scope Withdrawal Time: 0 hours 14 minutes 51 seconds  Total Procedure Duration: 0 hours 19 minutes 56 seconds  Findings:                 Skin tags were found on perianal exam.                           A 4 mm polyp was found in the descending colon. The                            polyp was sessile. The polyp was removed with a                            cold snare. Resection and retrieval were complete.                            Estimated blood loss was minimal.                           A 5 mm polyp was found in the sigmoid colon. The                            polyp was sessile. The polyp was removed with a                            cold snare. Resection and retrieval were complete.                            Estimated blood loss was minimal.                           A 2 mm polyp was found in the rectum. The polyp was                            sessile. The polyp was removed with a cold  snare.                            Resection and retrieval were complete. Estimated                            blood loss was minimal.                           Non-bleeding internal hemorrhoids were found during                            retroflexion. The hemorrhoids were small. Complications:            No immediate complications. Estimated Blood Loss:     Estimated blood loss was minimal. Impression:               - Perianal skin tags found on perianal exam.                           -  One 4 mm polyp in the descending colon, removed                            with a cold snare. Resected and retrieved.                           - One 5 mm polyp in the sigmoid colon, removed with                            a cold snare. Resected and retrieved.                           - One 2 mm polyp in the rectum, removed with a cold                            snare. Resected and retrieved.                           - Non-bleeding internal hemorrhoids.                           - The GI Genius (intelligent endoscopy module),                            computer-aided polyp detection system powered by AI                            was utilized to detect colorectal polyps through                            enhanced visualization during colonoscopy. Recommendation:           - Patient has a contact number available for                            emergencies. The signs and symptoms of potential                            delayed complications were discussed with the                            patient. Return to normal activities tomorrow.                            Written discharge instructions were provided to the                            patient.                           - Resume previous diet.                           - Continue present medications.                           -  Await pathology results.                           - Repeat colonoscopy for surveillance based on                             pathology results.                           - Return to GI clinic PRN. Doristine LocksVito Lavoy Bernards, MD 12/04/2022 11:07:50 AM

## 2022-12-07 ENCOUNTER — Telehealth: Payer: Self-pay | Admitting: *Deleted

## 2022-12-07 NOTE — Telephone Encounter (Signed)
  Follow up Call-     12/04/2022   10:00 AM  Call back number  Post procedure Call Back phone  # [phone]  Permission to leave phone message Yes     Patient questions:  Do you have a fever, pain , or abdominal swelling? No. Pain Score  0 *  Have you tolerated food without any problems? Yes.    Have you been able to return to your normal activities? Yes.    Do you have any questions about your discharge instructions: Diet   No. Medications  No. Follow up visit  No.  Do you have questions or concerns about your Care? No.  Actions: * If pain score is 4 or above: No action needed, pain <4.

## 2022-12-08 ENCOUNTER — Telehealth: Payer: Self-pay | Admitting: Gastroenterology

## 2022-12-08 NOTE — Telephone Encounter (Signed)
Patient called to go over the path results.

## 2022-12-09 NOTE — Telephone Encounter (Signed)
5 year recall in Epic. Patient reviewed MyChart message with results and recommendations. Seen by patient Alejandra Figueroa on 12/08/2022  6:13 PM

## 2022-12-28 ENCOUNTER — Ambulatory Visit: Payer: 59 | Admitting: Internal Medicine

## 2023-01-01 ENCOUNTER — Ambulatory Visit (INDEPENDENT_AMBULATORY_CARE_PROVIDER_SITE_OTHER): Payer: 59 | Admitting: Internal Medicine

## 2023-01-01 ENCOUNTER — Encounter: Payer: Self-pay | Admitting: Internal Medicine

## 2023-01-01 DIAGNOSIS — L02419 Cutaneous abscess of limb, unspecified: Secondary | ICD-10-CM | POA: Diagnosis not present

## 2023-01-01 NOTE — Assessment & Plan Note (Signed)
Overall resolved s/p I and D 10/2022. Small opening with spontaneous drainage. No signs of infection so antibiotics not indicated today.

## 2023-01-01 NOTE — Assessment & Plan Note (Signed)
Weight stable and counseled about diet and she is making changes.

## 2023-01-01 NOTE — Progress Notes (Signed)
   Subjective:   Patient ID: Alejandra Figueroa, female    DOB: 26-Jul-1986, 37 y.o.   MRN: [id number]  HPI The patient is a 37 YO female coming in for cyst under armpit.   Review of Systems  Constitutional: Negative.   HENT: Negative.    Eyes: Negative.   Respiratory:  Negative for cough, chest tightness and shortness of breath.   Cardiovascular:  Negative for chest pain, palpitations and leg swelling.  Gastrointestinal:  Negative for abdominal distention, abdominal pain, constipation, diarrhea, nausea and vomiting.  Musculoskeletal: Negative.   Skin:  Positive for wound.  Neurological: Negative.   Psychiatric/Behavioral: Negative.      Objective:  Physical Exam Constitutional:      Appearance: She is well-developed. She is obese.  HENT:     Head: Normocephalic and atraumatic.  Cardiovascular:     Rate and Rhythm: Normal rate and regular rhythm.  Pulmonary:     Effort: Pulmonary effort is normal. No respiratory distress.     Breath sounds: Normal breath sounds. No wheezing or rales.  Abdominal:     General: Bowel sounds are normal. There is no distension.     Palpations: Abdomen is soft.     Tenderness: There is no abdominal tenderness. There is no rebound.  Musculoskeletal:     Cervical back: Normal range of motion.  Skin:    General: Skin is warm and dry.     Comments: Left axillae with 1 mm circular hole with minimal spontaneous drainage, no abscess or purulence detected or surrounding erythema  Neurological:     Mental Status: She is alert and oriented to person, place, and time.     Coordination: Coordination normal.     Vitals:   01/01/23 0811 01/01/23 0813 01/01/23 0830  BP: (!) 160/100 (!) 160/100 132/86  Pulse: 86    Temp: 98.3 F (36.8 C)    TempSrc: Oral    SpO2: 98%    Weight: 264 lb (119.7 kg)    Height: 5\' 7"  (1.702 m)      Assessment & Plan:  Visit time 15 minutes in face to face communication with patient and coordination of care, additional 5  minutes spent in record review, coordination or care, ordering tests, communicating/referring to other healthcare professionals, documenting in medical records all on the same day of the visit for total time 20 minutes spent on the visit.

## 2023-04-12 ENCOUNTER — Telehealth: Payer: Self-pay | Admitting: Internal Medicine

## 2023-04-12 NOTE — Telephone Encounter (Signed)
Patient was seen 01/01/2023 for a cyst under her arm. She said it was still leaking a little and wanted to know what Dr. Okey Dupre would recommend. She didn't know if she should see Dr. Okey Dupre again or if she needs a referral. Patient would like a call back at [phone].

## 2023-04-13 NOTE — Telephone Encounter (Signed)
Hard to tell from information. Some cysts can be chronic and go down and up. If redness, swelling, pain or fevers should have visit. Otherwise okay for visit if patient desires.

## 2023-04-13 NOTE — Telephone Encounter (Signed)
Pt has been scheduled.  °

## 2023-04-15 ENCOUNTER — Encounter: Payer: Self-pay | Admitting: Internal Medicine

## 2023-04-15 ENCOUNTER — Ambulatory Visit (INDEPENDENT_AMBULATORY_CARE_PROVIDER_SITE_OTHER): Payer: 59 | Admitting: Internal Medicine

## 2023-04-15 VITALS — BP 136/82 | HR 75 | Temp 98.1°F | Ht 67.0 in | Wt 259.0 lb

## 2023-04-15 DIAGNOSIS — L02419 Cutaneous abscess of limb, unspecified: Secondary | ICD-10-CM | POA: Diagnosis not present

## 2023-04-15 NOTE — Progress Notes (Signed)
   Subjective:   Patient ID: Alejandra Figueroa, female    DOB: Dec 03, 1985, 37 y.o.   MRN: [id number]  HPI The patient is a 37 YO female coming in for cyst under armpit.  Review of Systems  Constitutional: Negative.   HENT: Negative.    Eyes: Negative.   Respiratory:  Negative for cough, chest tightness and shortness of breath.   Cardiovascular:  Negative for chest pain, palpitations and leg swelling.  Gastrointestinal:  Negative for abdominal distention, abdominal pain, constipation, diarrhea, nausea and vomiting.  Musculoskeletal: Negative.   Skin: Negative.        cyst  Neurological: Negative.   Psychiatric/Behavioral: Negative.      Objective:  Physical Exam Constitutional:      Appearance: She is well-developed.  HENT:     Head: Normocephalic and atraumatic.  Cardiovascular:     Rate and Rhythm: Normal rate and regular rhythm.  Pulmonary:     Effort: Pulmonary effort is normal. No respiratory distress.     Breath sounds: Normal breath sounds. No wheezing or rales.  Abdominal:     General: Bowel sounds are normal. There is no distension.     Palpations: Abdomen is soft.     Tenderness: There is no abdominal tenderness. There is no rebound.  Musculoskeletal:     Cervical back: Normal range of motion.  Skin:    General: Skin is warm and dry.     Comments: Cyst under left armpit not infected and pinpoint hole which is not draining  Neurological:     Mental Status: She is alert and oriented to person, place, and time.     Coordination: Coordination normal.     Vitals:   04/15/23 1007  BP: 136/82  Pulse: 75  Temp: 98.1 F (36.7 C)  TempSrc: Oral  SpO2: 97%  Weight: 259 lb (117.5 kg)  Height: 5\' 7"  (1.702 m)    Assessment & Plan:  Visit time 15 minutes in face to face communication with patient and coordination of care, additional 5 minutes spent in record review, coordination or care, ordering tests, communicating/referring to other healthcare professionals,  documenting in medical records all on the same day of the visit for total time 20 minutes spent on the visit.

## 2023-04-15 NOTE — Assessment & Plan Note (Signed)
Not infected today. Advised to use non-antiperspirant and if recurrently infected could refer to surgery for removal

## 2023-04-27 ENCOUNTER — Telehealth: Payer: Self-pay | Admitting: Internal Medicine

## 2023-04-27 DIAGNOSIS — L02419 Cutaneous abscess of limb, unspecified: Secondary | ICD-10-CM

## 2023-04-27 NOTE — Telephone Encounter (Signed)
Pt called stating she would like to be referred to a dermatologist like they spoken about before in office to get the cyst closed due to its starting to bleed. Please advise.

## 2023-04-30 NOTE — Telephone Encounter (Signed)
Called patient back and informed  her of MD comment

## 2023-04-30 NOTE — Telephone Encounter (Signed)
I have done referral to general surgery as they will be quickest to get this taken care of for her.

## 2023-05-31 ENCOUNTER — Other Ambulatory Visit: Payer: Self-pay | Admitting: Internal Medicine

## 2023-05-31 DIAGNOSIS — Z1231 Encounter for screening mammogram for malignant neoplasm of breast: Secondary | ICD-10-CM

## 2023-06-17 ENCOUNTER — Ambulatory Visit
Admission: RE | Admit: 2023-06-17 | Discharge: 2023-06-17 | Disposition: A | Discharge status: Home / Self Care | Payer: 59 | Source: Ambulatory Visit | Attending: Internal Medicine | Admitting: Internal Medicine

## 2023-06-17 ENCOUNTER — Other Ambulatory Visit: Payer: Self-pay | Admitting: Internal Medicine

## 2023-06-17 DIAGNOSIS — Z1231 Encounter for screening mammogram for malignant neoplasm of breast: Secondary | ICD-10-CM

## 2023-06-18 ENCOUNTER — Encounter: Payer: Self-pay | Admitting: Internal Medicine

## 2023-06-18 LAB — HM MAMMOGRAPHY

## 2023-10-16 ENCOUNTER — Emergency Department (HOSPITAL_BASED_OUTPATIENT_CLINIC_OR_DEPARTMENT_OTHER)
Admission: EM | Admit: 2023-10-16 | Discharge: 2023-10-16 | Disposition: A | Discharge status: Home / Self Care | Payer: 59 | Attending: Emergency Medicine | Admitting: Emergency Medicine

## 2023-10-16 DIAGNOSIS — F419 Anxiety disorder, unspecified: Secondary | ICD-10-CM | POA: Insufficient documentation

## 2023-10-16 DIAGNOSIS — Z91010 Allergy to peanuts: Secondary | ICD-10-CM | POA: Diagnosis not present

## 2023-10-16 DIAGNOSIS — R109 Unspecified abdominal pain: Secondary | ICD-10-CM | POA: Insufficient documentation

## 2023-10-16 MED ORDER — ONDANSETRON 4 MG PO TBDP
4.0000 mg | ORAL_TABLET | Freq: Once | ORAL | Status: AC
  Administered 2023-10-16: 4 mg via ORAL
  Filled 2023-10-16: qty 1

## 2023-10-16 MED ORDER — LORAZEPAM 1 MG PO TABS: 1.0000 mg | ORAL_TABLET | Freq: Three times a day (TID) | ORAL | 0 refills | Status: DC | PRN

## 2023-10-16 MED ORDER — LORAZEPAM 1 MG PO TABS
1.0000 mg | ORAL_TABLET | Freq: Once | ORAL | Status: AC
  Administered 2023-10-16: 1 mg via ORAL
  Filled 2023-10-16: qty 1

## 2023-10-16 NOTE — ED Provider Notes (Signed)
 [place] [facility] Provider Note   CSN: [id number] Arrival date & time: 10/16/23  4098     History  Chief Complaint  Patient presents with   Abdominal Pain   Anxiety    Alejandra Figueroa is a 38 y.o. female.  Patient seen emergency department today for evaluation of increased anxiety.  She states that she recently has had the flu.  With that she has had nausea and diarrhea.  This is improving.  She woke this morning and states that she had a involuntary muscle jerks.  This caused her anxiety to get worse.  Mother at bedside states that the patient worries that she has something like ALS or MS. she reportedly was pacing in the house last night and unable to sleep.  Patient denies muscle weakness.  She tried taking her hydroxyzine, prescribed by one of her primary providers, did not help much.  This prompted her visit today.  No recent fevers, chest pain, shortness of breath.  No vomiting.       Home Medications Prior to Admission medications   Medication Sig Start Date End Date Taking? Authorizing Provider  ALPRAZolam (XANAX) 0.25 MG tablet Take 1 tablet (0.25 mg total) by mouth 2 (two) times daily as needed for anxiety. 03/02/22   Myrlene Broker, MD  cyclobenzaprine (FLEXERIL) 5 MG tablet Take 5 mg by mouth 2 (two) times daily.    [provider]  diazepam (VALIUM) 10 MG tablet Take 10 mg by mouth every 6 (six) hours as needed. 12/02/16   [provider]  hydrOXYzine (ATARAX) 25 MG tablet Take 25 mg by mouth 4 (four) times daily as needed. 01/21/22   [provider]  medroxyPROGESTERone (PROVERA) 10 MG tablet Take 1 tablet by mouth. For 12 days until the next month 04/10/20   [provider]      Allergies    Other, Morphine and codeine, Nystatin, and Peanut-containing drug products    Review of Systems   Review of Systems  Physical Exam Updated Vital Signs BP (!) 158/117   Pulse (!) 104   Temp  98.3 F (36.8 C) (Oral)   Ht 5\' 6"  (1.676 m)   Wt 121.6 kg   LMP 09/15/2023 (Approximate)   SpO2 100%   BMI 43.26 kg/m  Physical Exam Vitals and nursing note reviewed.  Constitutional:      General: She is not in acute distress.    Appearance: She is well-developed.  HENT:     Head: Normocephalic and atraumatic.     Right Ear: External ear normal.     Left Ear: External ear normal.     Nose: Nose normal.  Eyes:     Conjunctiva/sclera: Conjunctivae normal.  Cardiovascular:     Rate and Rhythm: Normal rate and regular rhythm.     Heart sounds: No murmur heard. Pulmonary:     Effort: No respiratory distress.     Breath sounds: No wheezing, rhonchi or rales.  Abdominal:     Palpations: Abdomen is soft.     Tenderness: There is no abdominal tenderness. There is no guarding or rebound.     Comments: Abdomen soft and nontender  Musculoskeletal:     Cervical back: Normal range of motion and neck supple.     Right lower leg: No edema.     Left lower leg: No edema.  Skin:    General: Skin is warm and dry.     Findings: No rash.  Neurological:     General: No focal deficit present.     Mental Status: She is alert. Mental status is at baseline.     Motor: No weakness.  Psychiatric:        Mood and Affect: Mood is anxious.     ED Results / Procedures / Treatments   Labs (all labs ordered are listed, but only abnormal results are displayed) Labs Reviewed - No data to display  EKG None  Radiology No results found.  Procedures Procedures    Medications Ordered in ED Medications  LORazepam (ATIVAN) tablet 1 mg (has no administration in time range)  ondansetron (ZOFRAN-ODT) disintegrating tablet 4 mg (has no administration in time range)    ED Course/ Medical Decision Making/ A&P    Patient seen and examined. History obtained directly from patient and family member at bedside.  Labs/EKG: None ordered  Imaging: None ordered  Medications/Fluids: Ordered: P.o.  Ativan, ODT Zofran  Most recent vital signs reviewed and are as follows: BP (!) 158/117   Pulse (!) 104   Temp 98.3 F (36.8 C) (Oral)   Ht 5\' 6"  (1.676 m)   Wt 121.6 kg   LMP 09/15/2023 (Approximate)   SpO2 100%   BMI 43.26 kg/m   Initial impression: Anxiety, patient well-appearing.  No focal deficits.  12:48 PM Reassessment performed. Patient appears comfortable.  Normal vitals.  She does not feel entirely back to herself but was able to rest for a bit.  Reviewed pertinent lab work and imaging with patient at bedside. Questions answered.  We had a very good discussion about utility of lab work and that will be unlikely that this would be helpful on an immediate basis.  Encouraged outpatient follow-up with PCP to discuss routine labs and evaluations.  Patient will likely need more comprehensive treatment for generalized anxiety.  I am willing to prescribe short-term of Ativan for her to use over the weekend until she can speak with her doctor, she has an appointment in 2 days.  Discussed that this medication carries risk of dependence and that it should be only used for short-term as needed basis.  Most current vital signs reviewed and are as follows: BP (!) 158/117   Pulse (!) 104   Temp 98.3 F (36.8 C) (Oral)   Ht 5\' 6"  (1.676 m)   Wt 121.6 kg   LMP 09/15/2023 (Approximate)   SpO2 100%   BMI 43.26 kg/m   Plan: Discharge to home.   Prescriptions written for: Ativan 1 mg # 8 tablets  Other home care instructions discussed: Rest, avoidance of stressors  ED return instructions discussed:   Follow-up instructions discussed: Patient encouraged to follow-up with their PCP in 2 days.                                 Medical Decision Making Risk Prescription drug management.   Patient with anxiety, symptoms consistent with this today.  No SI or HI.  She is only on hydroxyzine as needed.  She will likely need more comprehensive treatment.  Reassuring exam here.  No  indication for further workup at this time.  Patient does have PCP follow-up in 2 days.          Final Clinical Impression(s) / ED Diagnoses Final diagnoses:  Anxiety    Rx / DC Orders ED Discharge Orders          Ordered  LORazepam (ATIVAN) 1 MG tablet  Every 8 hours PRN        10/16/23 1247              Renne Crigler, PA-C 10/16/23 1251    Franne Forts, DO 10/20/23 2157

## 2023-10-16 NOTE — ED Triage Notes (Signed)
 Pt reports that she is just getting over the flu. States that she has anxiety and that ever since she has been sick her anxiety is worse. Has had some nausea. States that she is figgity and her medication is not working.

## 2023-10-16 NOTE — Discharge Instructions (Signed)
 Please read and follow all provided instructions.  Your diagnoses today include:  1. Anxiety    Tests performed today include: Vital signs. See below for your results today.   Medications prescribed:  Ativan: Medication for anxiety  Do not combine this medication with alcohol.  This medication will make you sleepy.  Do not work or operate heavy machinery if taking.  Take any prescribed medications only as directed.  Home care instructions:  Follow any educational materials contained in this packet.  BE VERY CAREFUL not to take multiple medicines containing Tylenol (also called acetaminophen). Doing so can lead to an overdose which can damage your liver and cause liver failure and possibly death.   Follow-up instructions: Please follow-up with your primary care provider in 2 days as planned.  Return instructions:  Please return to the Emergency Department if you experience worsening symptoms.  Please return if you have any other emergent concerns.  Additional Information:  Your vital signs today were: BP (!) 158/117   Pulse (!) 104   Temp 98.3 F (36.8 C) (Oral)   Ht 5\' 6"  (1.676 m)   Wt 121.6 kg   LMP 09/15/2023 (Approximate)   SpO2 100%   BMI 43.26 kg/m  If your blood pressure (BP) was elevated above 135/85 this visit, please have this repeated by your doctor within one month. --------------

## 2023-10-18 ENCOUNTER — Ambulatory Visit (INDEPENDENT_AMBULATORY_CARE_PROVIDER_SITE_OTHER): Payer: 59 | Admitting: Internal Medicine

## 2023-10-18 ENCOUNTER — Encounter: Payer: Self-pay | Admitting: Internal Medicine

## 2023-10-18 ENCOUNTER — Telehealth: Payer: Self-pay | Admitting: Internal Medicine

## 2023-10-18 VITALS — BP 126/100 | HR 106 | Temp 98.9°F | Ht 66.0 in | Wt 273.0 lb

## 2023-10-18 DIAGNOSIS — R253 Fasciculation: Secondary | ICD-10-CM | POA: Diagnosis not present

## 2023-10-18 DIAGNOSIS — F419 Anxiety disorder, unspecified: Secondary | ICD-10-CM | POA: Diagnosis not present

## 2023-10-18 DIAGNOSIS — D573 Sickle-cell trait: Secondary | ICD-10-CM

## 2023-10-18 DIAGNOSIS — R7303 Prediabetes: Secondary | ICD-10-CM | POA: Diagnosis not present

## 2023-10-18 DIAGNOSIS — Z0000 Encounter for general adult medical examination without abnormal findings: Secondary | ICD-10-CM

## 2023-10-18 DIAGNOSIS — Z13220 Encounter for screening for lipoid disorders: Secondary | ICD-10-CM

## 2023-10-18 LAB — LIPID PANEL
Cholesterol: 124 mg/dL (ref 0–200)
HDL: 32.9 mg/dL — ABNORMAL LOW (ref >39.00)
LDL Cholesterol: 75 mg/dL (ref 0–99)
NonHDL: 91.09
Total CHOL/HDL Ratio: 4
Triglycerides: 78 mg/dL (ref 0.0–149.0)
VLDL: 15.6 mg/dL (ref 0.0–40.0)

## 2023-10-18 LAB — COMPREHENSIVE METABOLIC PANEL
ALT: 10 U/L (ref 0–35)
AST: 17 U/L (ref 0–37)
Albumin: 4.3 g/dL (ref 3.5–5.2)
Alkaline Phosphatase: 50 U/L (ref 39–117)
BUN: 8 mg/dL (ref 6–23)
CO2: 27 meq/L (ref 19–32)
Calcium: 9.3 mg/dL (ref 8.4–10.5)
Chloride: 105 meq/L (ref 96–112)
Creatinine, Ser: 0.55 mg/dL (ref 0.40–1.20)
GFR: 115.87 mL/min (ref >60.00)
Glucose, Bld: 111 mg/dL — ABNORMAL HIGH (ref 70–99)
Potassium: 3.8 meq/L (ref 3.5–5.1)
Sodium: 141 meq/L (ref 135–145)
Total Bilirubin: 0.7 mg/dL (ref 0.2–1.2)
Total Protein: 8 g/dL (ref 6.0–8.3)

## 2023-10-18 LAB — CBC
HCT: 42.5 % (ref 36.0–46.0)
Hemoglobin: 14.4 g/dL (ref 12.0–15.0)
MCHC: 33.8 g/dL (ref 30.0–36.0)
MCV: 80.2 fL (ref 78.0–100.0)
Platelets: 215 10*3/uL (ref 150.0–400.0)
RBC: 5.3 Mil/uL — ABNORMAL HIGH (ref 3.87–5.11)
RDW: 13.5 % (ref 11.5–15.5)
WBC: 3.7 10*3/uL — ABNORMAL LOW (ref 4.0–10.5)

## 2023-10-18 LAB — VITAMIN B12: Vitamin B-12: >1537 pg/mL — ABNORMAL HIGH (ref 211–911)

## 2023-10-18 LAB — VITAMIN D 25 HYDROXY (VIT D DEFICIENCY, FRACTURES): VITD: 41.67 ng/mL (ref 30.00–100.00)

## 2023-10-18 LAB — HEMOGLOBIN A1C: Hgb A1c MFr Bld: 5.5 % (ref 4.6–6.5)

## 2023-10-18 LAB — TSH: TSH: 1.39 u[IU]/mL (ref 0.35–5.50)

## 2023-10-18 MED ORDER — LORAZEPAM 0.5 MG PO TABS
0.5000 mg | ORAL_TABLET | Freq: Two times a day (BID) | ORAL | 0 refills | Status: DC | PRN
Start: 2023-10-18 — End: 2023-12-31

## 2023-10-18 NOTE — Assessment & Plan Note (Signed)
 BMI 44 and continue to advise diet and exercise.

## 2023-10-18 NOTE — Assessment & Plan Note (Signed)
 Stable checking CBC.

## 2023-10-18 NOTE — Telephone Encounter (Signed)
 Just and FYI

## 2023-10-18 NOTE — Telephone Encounter (Signed)
 Copied from CRM [id number]. Topic: Clinical - Lab/Test Results >> Oct 18, 2023 11:14 AM Clayton Bibles wrote: Reason for CRM: Meggie has a question about her lab results. She wants a nurse to call her at [phone].

## 2023-10-18 NOTE — Patient Instructions (Signed)
 We will check the labs today and have refilled the lorazepam.   Let us know how you are doing.

## 2023-10-18 NOTE — Telephone Encounter (Signed)
 I informed patient that her results has not yet  been released to her provider but once they do we will call her back. She has concerns about WBC and RBC

## 2023-10-18 NOTE — Assessment & Plan Note (Addendum)
 New problem in the last few months and sometimes lasts a few minutes and sometimes a few days. In the arms, gluteus. We discussed that usually there is not a specific cause. She does not have change in caffeine or sleep. She has not had significant change in diet or salt intake. Checking CBC, CMP, TSH, vitamin D and B12.

## 2023-10-18 NOTE — Assessment & Plan Note (Signed)
 Having significant anxiety with this new health problem. Counseled about coping skills including exercise, self-care. Rx lorazepam 0.5 mg bid prn for anxiety and we discussed that if this becomes regular we would need to assess long term management.

## 2023-10-18 NOTE — Progress Notes (Signed)
   Subjective:   Patient ID: Alejandra Figueroa, female    DOB: Oct 11, 1985, 38 y.o.   MRN: [id number]  HPI The patient is a 38 YO female coming in for ER follow up (having some muscle twitching and went to ER regarding this and her anxiety surrounding). Given lorazepam and no labs taken or imaging. She has used this and it is effective for anxiety. She denies muscle weakness or numbness. She does have sometimes in the middle of night hands are numb on awakening (does a lot of typing in job). Denies vision problems or double vision or blurred vision.   Review of Systems  Constitutional: Negative.   HENT: Negative.    Eyes: Negative.   Respiratory:  Negative for cough, chest tightness and shortness of breath.   Cardiovascular:  Negative for chest pain, palpitations and leg swelling.  Gastrointestinal:  Negative for abdominal distention, abdominal pain, constipation, diarrhea, nausea and vomiting.  Musculoskeletal: Negative.        Muscle twitching  Skin: Negative.   Neurological: Negative.   Psychiatric/Behavioral: Negative.      Objective:  Physical Exam Constitutional:      Appearance: She is well-developed.  HENT:     Head: Normocephalic and atraumatic.  Cardiovascular:     Rate and Rhythm: Normal rate and regular rhythm.  Pulmonary:     Effort: Pulmonary effort is normal. No respiratory distress.     Breath sounds: Normal breath sounds. No wheezing or rales.  Abdominal:     General: Bowel sounds are normal. There is no distension.     Palpations: Abdomen is soft.     Tenderness: There is no abdominal tenderness. There is no rebound.  Musculoskeletal:     Cervical back: Normal range of motion.  Skin:    General: Skin is warm and dry.  Neurological:     Mental Status: She is alert and oriented to person, place, and time.     Coordination: Coordination normal.     Vitals:   10/18/23 0808  BP: (!) 126/100  Pulse: (!) 106  Temp: 98.9 F (37.2 C)  TempSrc: Oral  SpO2:  99%  Weight: 273 lb (123.8 kg)  Height: 5\' 6"  (1.676 m)    Assessment & Plan:  Visit time 25 minutes in face to face communication with patient and coordination of care, additional 10 minutes spent in record review, coordination or care, ordering tests, communicating/referring to other healthcare professionals, documenting in medical records all on the same day of the visit for total time 35 minutes spent on the visit.

## 2023-10-18 NOTE — Assessment & Plan Note (Signed)
Checking HgA1c for follow up.  

## 2023-10-19 ENCOUNTER — Encounter: Payer: Self-pay | Admitting: Internal Medicine

## 2023-12-14 ENCOUNTER — Encounter: Payer: Self-pay | Admitting: Internal Medicine

## 2023-12-14 ENCOUNTER — Ambulatory Visit (INDEPENDENT_AMBULATORY_CARE_PROVIDER_SITE_OTHER): Admitting: Internal Medicine

## 2023-12-14 VITALS — BP 132/82 | HR 89 | Temp 98.0°F | Ht 66.0 in | Wt 276.0 lb

## 2023-12-14 DIAGNOSIS — R350 Frequency of micturition: Secondary | ICD-10-CM | POA: Diagnosis not present

## 2023-12-14 LAB — POC URINALSYSI DIPSTICK (AUTOMATED)
Bilirubin, UA: NEGATIVE
Blood, UA: NEGATIVE
Glucose, UA: NEGATIVE
Leukocytes, UA: NEGATIVE
Nitrite, UA: NEGATIVE
Protein, UA: POSITIVE — AB
Spec Grav, UA: 1.025 (ref 1.010–1.025)
Urobilinogen, UA: 0.2 U/dL
pH, UA: 5.5 (ref 5.0–8.0)

## 2023-12-14 NOTE — Progress Notes (Signed)
 Subjective:    Patient ID: Alejandra Figueroa, female    DOB: 1985/11/17, 38 y.o.   MRN: [id number]      HPI Alejandra Figueroa is here for  Chief Complaint  Patient presents with   Urinary Tract Infection    Started last month; Today she is still having pain, pressure and discomfort with urination; frequency and metal taste in mouth when she pees sometimes; pressure in abdomen    A couple of weeks ago she did a telehealth for possible UTI- given abx, but not sure which one-she did take one will bid x 7 days.   She thinks there was some improvement.  She took the home AZO UTI test after completing it and it was positive.  She is concerned she still has UTI.  Symptoms she has now - frequent urination, pressure in the lower back at times, metal taste in her mouth after urinating ( none x couple of days),  lower abdominal pressure, cramping.   She is taking AZO uti pills x a couple of days and drinking non-sugar cranberry juice.    She is feeling less pressure in in her lower abdomen.    She is about to start her menses.  She does get cramping with her menstrual cycle   Medications and allergies reviewed with patient and updated if appropriate.  Current Outpatient Medications on File Prior to Visit  Medication Sig Dispense Refill   cyclobenzaprine (FLEXERIL) 5 MG tablet Take 5 mg by mouth 2 (two) times daily.     hydrOXYzine (ATARAX) 25 MG tablet Take 25 mg by mouth 4 (four) times daily as needed.     LORazepam (ATIVAN) 0.5 MG tablet Take 1 tablet (0.5 mg total) by mouth 2 (two) times daily as needed for anxiety. 20 tablet 0   medroxyPROGESTERone (PROVERA) 10 MG tablet Take 1 tablet by mouth. For 12 days until the next month     No current facility-administered medications on file prior to visit.    Review of Systems  Constitutional:  Negative for fever.  Gastrointestinal:  Positive for abdominal pain (lower abdominal pressure). Negative for nausea.  Genitourinary:  Positive for  frequency. Negative for difficulty urinating, dysuria, hematuria and urgency.  Musculoskeletal:  Positive for back pain (lower back pressure).       Objective:   Vitals:   12/14/23 1045 12/14/23 1145  BP: (!) 140/80 132/82  Pulse: 89   Temp: 98 F (36.7 C)   SpO2: 100%    BP Readings from Last 3 Encounters:  12/14/23 132/82  10/18/23 (!) 126/100  10/16/23 125/70   Wt Readings from Last 3 Encounters:  12/14/23 276 lb (125.2 kg)  10/18/23 273 lb (123.8 kg)  10/16/23 268 lb (121.6 kg)   Body mass index is 44.55 kg/m.    Physical Exam Constitutional:      General: She is not in acute distress.    Appearance: Normal appearance. She is not ill-appearing.  HENT:     Head: Normocephalic.  Eyes:     Conjunctiva/sclera: Conjunctivae normal.  Abdominal:     General: There is no distension.     Palpations: Abdomen is soft.     Tenderness: There is no abdominal tenderness. There is no right CVA tenderness, left CVA tenderness, guarding or rebound.  Skin:    General: Skin is warm and dry.  Neurological:     Mental Status: She is alert.            Assessment &  Plan:    Urinary frequency: Acute  ? UTI vs related to menses Urine dip with blood and protein - no obvious infection Will send for culture No abx now unless symptoms worsen before cx returns or if culture is positive for infection Discussed her symptoms may be from her menses or other cause     Elevated BP: BP has been variable and often elevated BP rechecked and better Advised to start monitoring BP at home and discussed goal  of < 130/80 Has lost a lot of weight and continuing to work on weight loss

## 2023-12-14 NOTE — Patient Instructions (Addendum)
     You BP should ideally be <  130/80   We will send your urine for culture - no antibiotic for now unless culture is positive.       Return if symptoms worsen or fail to improve.

## 2023-12-16 ENCOUNTER — Ambulatory Visit: Payer: Self-pay

## 2023-12-16 LAB — CULTURE, URINE COMPREHENSIVE: RESULT:: NO GROWTH

## 2023-12-16 NOTE — Telephone Encounter (Signed)
 Pt requesting urine results, answered questions regarding urinalysis vs culture. Pt will await call for culture results.  Copied from CRM [id number]. Topic: General - Call Back - No Documentation >> Dec 16, 2023  4:59 PM Shereese L wrote: Reason for CRM: patient would like a call back in reference results Reason for Disposition  Health Information question, no triage required and triager able to answer question  Answer Assessment - Initial Assessment Questions 1. REASON FOR CALL or QUESTION: "What is your reason for calling today?" or "How can I best help you?" or "What question do you have that I can help answer?"     Urine results  Protocols used: Information Only Call - No Triage-A-AH

## 2023-12-17 ENCOUNTER — Encounter: Payer: Self-pay | Admitting: Internal Medicine

## 2023-12-20 NOTE — Telephone Encounter (Signed)
 Seen by patient Alejandra Figueroa on 12/17/2023  8:55 AM

## 2023-12-31 ENCOUNTER — Other Ambulatory Visit: Payer: Self-pay | Admitting: Internal Medicine

## 2024-01-03 MED ORDER — LORAZEPAM 0.5 MG PO TABS: 0.5000 mg | ORAL_TABLET | Freq: Two times a day (BID) | ORAL | 0 refills | Status: DC | PRN

## 2024-01-03 MED ORDER — CYCLOBENZAPRINE HCL 5 MG PO TABS: 5.0000 mg | ORAL_TABLET | Freq: Two times a day (BID) | ORAL | 1 refills | Status: DC

## 2024-03-28 NOTE — Progress Notes (Unsigned)
    Subjective:    Patient ID: Alejandra Figueroa, female    DOB: 02-09-1986, 38 y.o.   MRN: [id number]      HPI Alejandra Figueroa is here for No chief complaint on file.   Headaches, nausea -      Medications and allergies reviewed with patient and updated if appropriate.  Current Outpatient Medications on File Prior to Visit  Medication Sig Dispense Refill   cyclobenzaprine  (FLEXERIL ) 5 MG tablet Take 1 tablet (5 mg total) by mouth 2 (two) times daily. 30 tablet 1   hydrOXYzine (ATARAX) 25 MG tablet Take 25 mg by mouth 4 (four) times daily as needed.     LORazepam  (ATIVAN ) 0.5 MG tablet Take 1 tablet (0.5 mg total) by mouth 2 (two) times daily as needed for anxiety. 20 tablet 0   medroxyPROGESTERone  (PROVERA ) 10 MG tablet Take 1 tablet by mouth. For 12 days until the next month     No current facility-administered medications on file prior to visit.    Review of Systems     Objective:  There were no vitals filed for this visit. BP Readings from Last 3 Encounters:  12/14/23 132/82  10/18/23 (!) 126/100  10/16/23 125/70   Wt Readings from Last 3 Encounters:  12/14/23 276 lb (125.2 kg)  10/18/23 273 lb (123.8 kg)  10/16/23 268 lb (121.6 kg)   There is no height or weight on file to calculate BMI.    Physical Exam         Assessment & Plan:    See Problem List for Assessment and Plan of chronic medical problems.

## 2024-03-29 ENCOUNTER — Encounter: Payer: Self-pay | Admitting: Internal Medicine

## 2024-03-29 ENCOUNTER — Ambulatory Visit (INDEPENDENT_AMBULATORY_CARE_PROVIDER_SITE_OTHER): Admitting: Internal Medicine

## 2024-03-29 VITALS — BP 132/70 | HR 88 | Temp 98.1°F | Ht 66.0 in | Wt 277.0 lb

## 2024-03-29 DIAGNOSIS — G4452 New daily persistent headache (NDPH): Secondary | ICD-10-CM

## 2024-03-29 MED ORDER — MELOXICAM 15 MG PO TABS: 15.0000 mg | ORAL_TABLET | Freq: Every day | ORAL | 0 refills | Status: DC | PRN

## 2024-03-29 NOTE — Assessment & Plan Note (Signed)
 Acute Posterior head, neck tension, mild temple pain at times Some nausea Not likely a migraine Has improved a little today Flexeril  nightly x 2-3 days Meloxicam  15 mg daily x 2-3 day  - advised to take with food Heat, stretching Discussed headache may be related to position while doing work - may need to revise Call if no improvement

## 2024-03-29 NOTE — Patient Instructions (Addendum)
      Medications changes include :   meloxicam  15 mg daily as needed     Return if symptoms worsen or fail to improve.

## 2024-04-19 ENCOUNTER — Other Ambulatory Visit: Payer: Self-pay | Admitting: Internal Medicine

## 2024-04-25 ENCOUNTER — Other Ambulatory Visit: Payer: Self-pay | Admitting: Internal Medicine

## 2024-04-30 ENCOUNTER — Telehealth: Admitting: Family

## 2024-04-30 DIAGNOSIS — T148XXA Other injury of unspecified body region, initial encounter: Secondary | ICD-10-CM

## 2024-04-30 NOTE — Progress Notes (Signed)
 Virtual Visit Consent   Alejandra Figueroa, you are scheduled for a virtual visit with a [facility] provider today. Just as with appointments in the office, your consent must be obtained to participate. Your consent will be active for this visit and any virtual visit you may have with one of our providers in the next 365 days. If you have a MyChart account, a copy of this consent can be sent to you electronically.  As this is a virtual visit, video technology does not allow for your provider to perform a traditional examination. This may limit your provider's ability to fully assess your condition. If your provider identifies any concerns that need to be evaluated in person or the need to arrange testing (such as labs, EKG, etc.), we will make arrangements to do so. Although advances in technology are sophisticated, we cannot ensure that it will always work on either your end or our end. If the connection with a video visit is poor, the visit may have to be switched to a telephone visit. With either a video or telephone visit, we are not always able to ensure that we have a secure connection.  By engaging in this virtual visit, you consent to the provision of healthcare and authorize for your insurance to be billed (if applicable) for the services provided during this visit. Depending on your insurance coverage, you may receive a charge related to this service.  I need to obtain your verbal consent now. Are you willing to proceed with your visit today? Alejandra Figueroa has provided verbal consent on 04/30/2024 for a virtual visit (video or telephone). Bari Learn, FNP  Date: 04/30/2024 7:09 PM   Virtual Visit via Video Note   I, Bari Learn, connected with  Alejandra Figueroa  ([id number], [birth year]) on 04/30/24 at  7:00 PM EDT by a video-enabled telemedicine application and verified that I am speaking with the correct person using two identifiers.  Location: Patient: Virtual Visit Location  Patient: Home Provider: Virtual Visit Location Provider: Home Office   I discussed the limitations of evaluation and management by telemedicine and the availability of in person appointments. The patient expressed understanding and agreed to proceed.    History of Present Illness: Alejandra Figueroa is a 38 y.o. who identifies as a female who was assigned female at birth, and is being seen today for nail glue stuck on her right eye. Reports she accidentally used nail glue on her eyelid. She was able to get her open now. But still has residue on her upper eye lid. Denies any vision changes.    HPI: HPI  Problems:  Patient Active Problem List   Diagnosis Date Noted   Muscle twitching 10/18/2023   Axillary abscess 01/01/2023   Headache 06/29/2022   Anxiety 05/02/2021   Encounter for general adult medical examination with abnormal findings 12/27/2020   History of adenomatous polyp of colon 05/29/2020   Cancer (HCC) 08/30/2018   Intramural and subserous leiomyoma of uterus 10/04/2017   Prediabetes 07/21/2017   Iron deficiency anemia 11/19/2016   Sickle cell trait (HCC) 02/13/2013   Polycystic ovarian syndrome 02/08/2013   Obesities, morbid (HCC) 12/09/2012   History of chemotherapy 11/20/2002    Allergies:  Allergies  Allergen Reactions   Other Swelling    Cinnamon gum; swelling of throat Cinnamon gum; swelling of throat    Morphine And Codeine Itching and Nausea And Vomiting   Nystatin  Swelling   Peanut-Containing Drug Products     Throat  swelling and itching. Gets really hot   Medications:  Current Outpatient Medications:    cyclobenzaprine  (FLEXERIL ) 5 MG tablet, TAKE 1 TABLET BY MOUTH TWICE A DAY, Disp: 30 tablet, Rfl: 1   hydrOXYzine (ATARAX) 25 MG tablet, Take 25 mg by mouth 4 (four) times daily as needed., Disp: , Rfl:    LORazepam  (ATIVAN ) 0.5 MG tablet, Take 1 tablet (0.5 mg total) by mouth 2 (two) times daily as needed for anxiety., Disp: 20 tablet, Rfl: 0    medroxyPROGESTERone  (PROVERA ) 10 MG tablet, Take 1 tablet by mouth. For 12 days until the next month, Disp: , Rfl:    meloxicam  (MOBIC ) 15 MG tablet, TAKE 1 TABLET (15 MG TOTAL) BY MOUTH DAILY WITH FOOD AS NEEDED FOR PAIN, Disp: 30 tablet, Rfl: 0  Observations/Objective: Patient is well-developed, well-nourished in no acute distress.  Resting comfortably  at home.  Head is normocephalic, atraumatic.  No labored breathing.  Speech is clear and coherent with logical content.  Patient is alert and oriented at baseline.  Right upper eyelid has dried glue   Assessment and Plan: 1. Glued skin wound (Primary)  Can use Warm soapy water with [employer] Massage Vaseline   Follow up if any burning or vision changes   Follow Up Instructions: I discussed the assessment and treatment plan with the patient. The patient was provided an opportunity to ask questions and all were answered. The patient agreed with the plan and demonstrated an understanding of the instructions.  A copy of instructions were sent to the patient via MyChart unless otherwise noted below.     The patient was advised to call back or seek an in-person evaluation if the symptoms worsen or if the condition fails to improve as anticipated.    Bari Learn, FNP

## 2024-05-24 ENCOUNTER — Other Ambulatory Visit: Payer: Self-pay | Admitting: Internal Medicine

## 2024-06-01 ENCOUNTER — Ambulatory Visit: Payer: Self-pay
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
## 2024-06-02 ENCOUNTER — Ambulatory Visit: Admitting: Nurse Practitioner

## 2024-06-02 VITALS — BP 122/84 | HR 78 | Temp 97.6°F | Ht 66.0 in | Wt 278.1 lb

## 2024-06-02 DIAGNOSIS — M545 Low back pain, unspecified: Secondary | ICD-10-CM

## 2024-06-02 MED ORDER — CYCLOBENZAPRINE HCL 5 MG PO TABS
5.0000 mg | ORAL_TABLET | Freq: Two times a day (BID) | ORAL | 1 refills | Status: AC
Start: 2024-06-02 — End: ?
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
## 2024-06-02 NOTE — Patient Instructions (Signed)
 Pain Management Tylenol : 500-1000mg  by mouth every 8 hours. Do not exceed 3000mg  of tylenol  in 24 hours. Ibuprofen: 200-600mg  by mouth every 8 hours as needed if you need something to help pain between tylenol  doses
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
## 2024-06-15 ENCOUNTER — Other Ambulatory Visit (HOSPITAL_COMMUNITY): Payer: Self-pay | Admitting: Obstetrics & Gynecology

## 2024-06-15 DIAGNOSIS — D219 Benign neoplasm of connective and other soft tissue, unspecified: Secondary | ICD-10-CM

## 2024-06-17 ENCOUNTER — Ambulatory Visit (HOSPITAL_BASED_OUTPATIENT_CLINIC_OR_DEPARTMENT_OTHER)
Admission: RE | Admit: 2024-06-17 | Discharge: 2024-06-17 | Disposition: A | Discharge status: Home / Self Care | Source: Ambulatory Visit | Attending: Obstetrics & Gynecology | Admitting: Obstetrics & Gynecology

## 2024-06-17 DIAGNOSIS — D219 Benign neoplasm of connective and other soft tissue, unspecified: Secondary | ICD-10-CM | POA: Insufficient documentation

## 2024-06-23 ENCOUNTER — Encounter: Payer: Self-pay | Admitting: Internal Medicine

## 2024-06-23 ENCOUNTER — Ambulatory Visit: Admitting: Internal Medicine

## 2024-06-23 DIAGNOSIS — Z6841 Body Mass Index (BMI) 40.0 and over, adult: Secondary | ICD-10-CM | POA: Diagnosis not present

## 2024-06-23 DIAGNOSIS — R030 Elevated blood-pressure reading, without diagnosis of hypertension: Secondary | ICD-10-CM | POA: Diagnosis not present

## 2024-06-23 NOTE — Assessment & Plan Note (Signed)
 She struggles with weight management, particularly after emotional triggers. Discussed sustainable, individualized diet and weight management strategies. Refer to a dietitian for personalized nutritional guidance.

## 2024-06-23 NOTE — Progress Notes (Deleted)
   Subjective:   Patient ID: Alejandra Figueroa, female    DOB: 1985/11/29, 38 y.o.   MRN: [id number]  Discussed the use of AI scribe software for clinical note transcription with the patient, who gave verbal consent to proceed.  History of Present Illness      Review of Systems  Objective:  Physical Exam  There were no vitals filed for this visit.  Assessment and Plan Assessment & Plan

## 2024-06-23 NOTE — Progress Notes (Signed)
   Subjective:   Patient ID: Alejandra Figueroa, female    DOB: 04-17-1986, 38 y.o.   MRN: [id number]  The patient is here for visit  Discussed the use of AI scribe software for clinical note transcription with the patient, who gave verbal consent to proceed.  History of Present Illness Alejandra Figueroa is a 38 year old female who presents with elevated blood pressure noted at a chiropractor's office.  Her blood pressure was elevated at a chiropractor's office, which she attributes to being on her menstrual period and experiencing anxiety. She was in pain during the visit, which she believes may have contributed to the elevated reading. She states that her blood pressure is usually normal and does not typically have issues with it.  She wants to consult with a dietitian for assistance with weight management. She previously lost eighty pounds but has struggled to maintain this weight loss following the passing of her father. She describes difficulty in meal prepping and maintaining a consistent diet, often resorting to eating whatever is available when hungry. She has been trying to incorporate smoothies with greens and fruits into her diet twice a day.  She is approaching 38 years old and is aware of the need for long-term dietary changes. She has previously used a meal delivery service but found it unsustainable due to cost. She is seeking a more sustainable approach to her diet that she can maintain long-term.  Review of Systems  Constitutional: Negative.   HENT: Negative.    Eyes: Negative.   Respiratory:  Negative for cough, chest tightness and shortness of breath.   Cardiovascular:  Negative for chest pain, palpitations and leg swelling.  Gastrointestinal:  Negative for abdominal distention, abdominal pain, constipation, diarrhea, nausea and vomiting.  Musculoskeletal: Negative.   Skin: Negative.   Neurological: Negative.   Psychiatric/Behavioral: Negative.      Objective:   Physical Exam Constitutional:      Appearance: She is well-developed. She is obese.  HENT:     Head: Normocephalic and atraumatic.  Cardiovascular:     Rate and Rhythm: Normal rate and regular rhythm.  Pulmonary:     Effort: Pulmonary effort is normal. No respiratory distress.     Breath sounds: Normal breath sounds. No wheezing or rales.  Abdominal:     General: Bowel sounds are normal. There is no distension.     Palpations: Abdomen is soft.     Tenderness: There is no abdominal tenderness.  Musculoskeletal:     Cervical back: Normal range of motion.  Skin:    General: Skin is warm and dry.  Neurological:     Mental Status: She is alert and oriented to person, place, and time.     Coordination: Coordination normal.     Vitals:   06/23/24 1141 06/23/24 1203  BP: (!) 144/82 134/72  Pulse: 100   Temp: 98 F (36.7 C)   TempSrc: Oral   SpO2: 97%   Weight: 280 lb 3.2 oz (127.1 kg)   Height: 5' 6 (1.676 m)     Assessment & Plan:  Assessment and Plan Assessment & Plan Morbid obesity She struggles with weight management, particularly after emotional triggers. Discussed sustainable, individualized diet and weight management strategies. Refer to a dietitian for personalized nutritional guidance.  Elevated Blood Pressure without diagnosis hypertension Previous elevation was likely situational due to anxiety, pain, and menstrual cycle. Blood pressure is normal today at 134/72 mmHg.

## 2024-06-23 NOTE — Patient Instructions (Signed)
 We will get  you in with the nutritionist.

## 2024-07-05 ENCOUNTER — Encounter: Admitting: Dietician

## 2024-07-13 ENCOUNTER — Other Ambulatory Visit: Payer: Self-pay | Admitting: Internal Medicine

## 2024-07-24 ENCOUNTER — Ambulatory Visit (INDEPENDENT_AMBULATORY_CARE_PROVIDER_SITE_OTHER): Admitting: Internal Medicine

## 2024-07-24 VITALS — BP 134/80 | HR 99 | Temp 97.9°F | Ht 66.0 in | Wt 276.0 lb

## 2024-07-24 DIAGNOSIS — R140 Abdominal distension (gaseous): Secondary | ICD-10-CM

## 2024-07-24 DIAGNOSIS — D251 Intramural leiomyoma of uterus: Secondary | ICD-10-CM

## 2024-07-24 DIAGNOSIS — F419 Anxiety disorder, unspecified: Secondary | ICD-10-CM | POA: Diagnosis not present

## 2024-07-24 DIAGNOSIS — D252 Subserosal leiomyoma of uterus: Secondary | ICD-10-CM

## 2024-07-24 MED ORDER — PANTOPRAZOLE SODIUM 40 MG PO TBEC: 40.0000 mg | DELAYED_RELEASE_TABLET | Freq: Every day | ORAL | 0 refills | Status: DC

## 2024-07-24 MED ORDER — ONDANSETRON 4 MG PO TBDP: 4.0000 mg | ORAL_TABLET | Freq: Three times a day (TID) | ORAL | 0 refills | Status: AC | PRN

## 2024-07-24 NOTE — Progress Notes (Unsigned)
 Subjective:   Patient ID: Alejandra Figueroa, female    DOB: 1986-08-23, 38 y.o.   MRN: [id number]  Discussed the use of AI scribe software for clinical note transcription with the patient, who gave verbal consent to proceed.  History of Present Illness Alejandra Figueroa is a 38 year old female who presents with gastrointestinal symptoms including cramps, gas, and nausea.  She has been experiencing gastrointestinal symptoms for the past month, including cramps and gas without pain. Initially, she had constipation followed by diarrhea with urgency. Over the last two weeks, symptoms have improved but she continues to experience nausea, bloating, and gas, sometimes radiating to her back and stomach. No heartburn, acid reflux, or sour taste in her mouth. Her bowel movements are normal without blood.  She has a history of fibroids, with the largest being 6.6 cm, which has grown from 4.4 cm over the past year. She is unsure if the fibroids are contributing to her digestive issues. An MRI for her back incidentally noted the fibroid, leading to a follow-up ultrasound. She experiences changes in her menstrual cycle, with periods occurring at different times and cramps even when not menstruating.  She experiences bloating and gas, particularly after eating or using the bathroom. Her diet is high in smoothies with greens and fruits, which she suspects may contribute to her symptoms. She eats about one meal a day, influenced by anxiety which affects her appetite.  She has a history of anxiety, which impacts her eating habits. Taking medication for anxiety helps her feel more like herself and improves her appetite.  Review of Systems  Constitutional: Negative.   HENT: Negative.    Eyes: Negative.   Respiratory:  Negative for cough, chest tightness and shortness of breath.   Cardiovascular:  Negative for chest pain, palpitations and leg swelling.  Gastrointestinal:  Positive for abdominal distention,  abdominal pain, constipation and nausea. Negative for diarrhea and vomiting.  Musculoskeletal: Negative.   Skin: Negative.   Neurological: Negative.   Psychiatric/Behavioral: Negative.      Objective:  Physical Exam Constitutional:      Appearance: She is well-developed. She is obese.  HENT:     Head: Normocephalic and atraumatic.  Cardiovascular:     Rate and Rhythm: Normal rate and regular rhythm.  Pulmonary:     Effort: Pulmonary effort is normal. No respiratory distress.     Breath sounds: Normal breath sounds. No wheezing or rales.  Abdominal:     General: Bowel sounds are normal. There is no distension.     Palpations: Abdomen is soft.     Tenderness: There is abdominal tenderness.  Musculoskeletal:     Cervical back: Normal range of motion.  Skin:    General: Skin is warm and dry.  Neurological:     Mental Status: She is alert and oriented to person, place, and time.     Coordination: Coordination normal.     Vitals:   07/24/24 1549  BP: 134/80  Pulse: 99  Temp: 97.9 F (36.6 C)  TempSrc: Oral  SpO2: 99%  Weight: 276 lb (125.2 kg)  Height: 5' 6 (1.676 m)    Assessment and Plan Assessment & Plan Gastrointestinal symptoms (bloating, cramps, gas, nausea, constipation/diarrhea)   Intermittent symptoms may relate to dietary changes and fibroids. Differential includes dietary causes and acid-related symptoms. Prescribe acid-blocking medication for one week. If no response we will need labs and imaging to assess. Advise dietary modifications to reduce gas and bloating. Schedule follow-up to  evaluate response.  Uterine leiomyomas (fibroids)   Fibroids present, largest 6.6 cm, growing slowly, contributing to gastrointestinal symptoms. Continue monitoring fibroid size and symptoms. Follow up with OBGYN in January. This could be affecting chronic GI issues but unless large recent growth it likely is not impacting current change in symptoms.   Anxiety disorder    Anxiety may exacerbate gastrointestinal symptoms and decrease appetite. Monitor for changes in anxiety symptoms and impact on gastrointestinal symptoms.

## 2024-07-24 NOTE — Patient Instructions (Addendum)
 We have sent in protonix  to take daily for 1 week. If this helps we will have you take this 2-4 weeks then stop.  We have sent in zofran  for nausea to use if needed.

## 2024-07-25 ENCOUNTER — Encounter: Payer: Self-pay | Admitting: Internal Medicine

## 2024-07-25 DIAGNOSIS — R140 Abdominal distension (gaseous): Secondary | ICD-10-CM | POA: Insufficient documentation

## 2024-07-25 NOTE — Assessment & Plan Note (Signed)
 Intermittent symptoms may relate to dietary changes and fibroids. Differential includes dietary causes and acid-related symptoms. Prescribe acid-blocking medication for one week. If no response we will need labs and imaging to assess. Advise dietary modifications to reduce gas and bloating. Schedule follow-up to evaluate response.

## 2024-07-25 NOTE — Assessment & Plan Note (Signed)
 Anxiety may exacerbate gastrointestinal symptoms and decrease appetite. Monitor for changes in anxiety symptoms and impact on gastrointestinal symptoms.

## 2024-07-25 NOTE — Assessment & Plan Note (Signed)
 Fibroids present, largest 6.6 cm, growing slowly, contributing to gastrointestinal symptoms. Continue monitoring fibroid size and symptoms. Follow up with OBGYN in January. This could be affecting chronic GI issues but unless large recent growth it likely is not impacting current change in symptoms.

## 2024-08-01 ENCOUNTER — Telehealth: Payer: Self-pay

## 2024-08-01 NOTE — Telephone Encounter (Signed)
 Copied from CRM #[id number]. Topic: Clinical - Medication Question >> Jul 31, 2024 12:39 PM Rea ORN wrote: Reason for CRM: Pt calling to request a new script for Lorazepam  1 mg. Pt stated the .5mg  is not helping and would like the dosage increase. Pt is also requesting more tablets to ordered instead of the 20 tablets that was order on previous rx.  Pt uses [facility] [address]

## 2024-08-02 NOTE — Telephone Encounter (Signed)
 How often is patient currently using this medication and how is she taking it (I.e. how many pills at a time)? This is meant to be a rarely used medication so if she is taking it daily we may need to consider if another strategy for her anxiety would be better for her overall.

## 2024-08-02 NOTE — Telephone Encounter (Signed)
 Spoke with pt and is doing a lot better. Has had some episodes lately where her anxiety was at a level 10. During this time pt would take 2 of the .5mg  a day then skip for 2 days and then take it again. Pt does have another medication that she uses. Pt stated that it is not as strong and uses it more. Pt stated that she only uses when anxiety reaches a level 10. Pt has 3 pills left right now and would like a refill on the medication. Please advise.

## 2024-08-04 MED ORDER — LORAZEPAM 0.5 MG PO TABS: 0.5000 mg | ORAL_TABLET | Freq: Two times a day (BID) | ORAL | 0 refills | Status: AC | PRN

## 2024-08-04 NOTE — Telephone Encounter (Signed)
 Pt is aware.

## 2024-08-04 NOTE — Telephone Encounter (Signed)
 Refilled

## 2024-08-04 NOTE — Addendum Note (Signed)
 Addended by: ROLLENE NORRIS A on: 08/04/2024 12:34 PM   Modules accepted: Orders

## 2024-08-15 ENCOUNTER — Other Ambulatory Visit: Payer: Self-pay | Admitting: Internal Medicine

## 2024-08-29 ENCOUNTER — Encounter (HOSPITAL_BASED_OUTPATIENT_CLINIC_OR_DEPARTMENT_OTHER): Payer: Self-pay

## 2024-08-29 ENCOUNTER — Other Ambulatory Visit: Payer: Self-pay

## 2024-08-29 ENCOUNTER — Emergency Department (HOSPITAL_BASED_OUTPATIENT_CLINIC_OR_DEPARTMENT_OTHER): Admitting: Radiology

## 2024-08-29 DIAGNOSIS — R0789 Other chest pain: Secondary | ICD-10-CM | POA: Diagnosis present

## 2024-08-29 DIAGNOSIS — E876 Hypokalemia: Secondary | ICD-10-CM | POA: Insufficient documentation

## 2024-08-29 DIAGNOSIS — R0989 Other specified symptoms and signs involving the circulatory and respiratory systems: Secondary | ICD-10-CM | POA: Insufficient documentation

## 2024-08-29 DIAGNOSIS — Z91010 Allergy to peanuts: Secondary | ICD-10-CM | POA: Diagnosis not present

## 2024-08-29 DIAGNOSIS — R000 Tachycardia, unspecified: Secondary | ICD-10-CM | POA: Insufficient documentation

## 2024-08-29 DIAGNOSIS — R110 Nausea: Secondary | ICD-10-CM | POA: Insufficient documentation

## 2024-08-29 DIAGNOSIS — R059 Cough, unspecified: Secondary | ICD-10-CM | POA: Diagnosis not present

## 2024-08-29 LAB — BASIC METABOLIC PANEL WITH GFR
Anion gap: 14 (ref 5–15)
BUN: 7 mg/dL (ref 6–20)
CO2: 21 mmol/L — ABNORMAL LOW (ref 22–32)
Calcium: 10 mg/dL (ref 8.9–10.3)
Chloride: 102 mmol/L (ref 98–111)
Creatinine, Ser: 0.52 mg/dL (ref 0.44–1.00)
GFR, Estimated: >60 mL/min
Glucose, Bld: 96 mg/dL (ref 70–99)
Potassium: 3.4 mmol/L — ABNORMAL LOW (ref 3.5–5.1)
Sodium: 138 mmol/L (ref 135–145)

## 2024-08-29 LAB — CBC
HCT: 42.8 % (ref 36.0–46.0)
Hemoglobin: 14.8 g/dL (ref 12.0–15.0)
MCH: 27 pg (ref 26.0–34.0)
MCHC: 34.6 g/dL (ref 30.0–36.0)
MCV: 78 fL — ABNORMAL LOW (ref 80.0–100.0)
Platelets: 225 K/uL (ref 150–400)
RBC: 5.49 MIL/uL — ABNORMAL HIGH (ref 3.87–5.11)
RDW: 14 % (ref 11.5–15.5)
WBC: 5.8 K/uL (ref 4.0–10.5)
nRBC: 0 % (ref 0.0–0.2)

## 2024-08-29 LAB — TROPONIN T, HIGH SENSITIVITY: Troponin T High Sensitivity: <15 ng/L (ref 0–19)

## 2024-08-29 NOTE — ED Triage Notes (Signed)
 Pt presents via POV c/o anxiety, chest tightness, and nausea intermittently today. Reports felt like she has had chest congestion over the last couple of days. Also reports productive cough.

## 2024-08-30 ENCOUNTER — Other Ambulatory Visit: Payer: Self-pay | Admitting: Internal Medicine

## 2024-08-30 ENCOUNTER — Emergency Department (HOSPITAL_BASED_OUTPATIENT_CLINIC_OR_DEPARTMENT_OTHER)
Admission: EM | Admit: 2024-08-30 | Discharge: 2024-08-30 | Disposition: A | Discharge status: Home / Self Care | Attending: Emergency Medicine | Admitting: Emergency Medicine

## 2024-08-30 ENCOUNTER — Emergency Department (HOSPITAL_BASED_OUTPATIENT_CLINIC_OR_DEPARTMENT_OTHER)

## 2024-08-30 DIAGNOSIS — J111 Influenza due to unidentified influenza virus with other respiratory manifestations: Secondary | ICD-10-CM

## 2024-08-30 LAB — PREGNANCY, URINE: Preg Test, Ur: NEGATIVE

## 2024-08-30 LAB — D-DIMER, QUANTITATIVE: D-Dimer, Quant: 0.75 ug{FEU}/mL — ABNORMAL HIGH (ref 0.00–0.50)

## 2024-08-30 LAB — TROPONIN T, HIGH SENSITIVITY: Troponin T High Sensitivity: <15 ng/L (ref 0–19)

## 2024-08-30 MED ORDER — IOHEXOL 350 MG/ML SOLN
100.0000 mL | Freq: Once | INTRAVENOUS | Status: AC | PRN
  Administered 2024-08-30: 100 mL via INTRAVENOUS

## 2024-08-30 MED ORDER — LORAZEPAM 2 MG/ML IJ SOLN
1.0000 mg | Freq: Once | INTRAMUSCULAR | Status: AC
  Administered 2024-08-30: 1 mg via INTRAVENOUS
  Filled 2024-08-30: qty 1

## 2024-08-30 MED ORDER — BENZONATATE 100 MG PO CAPS: 100.0000 mg | ORAL_CAPSULE | Freq: Three times a day (TID) | ORAL | 0 refills | Status: AC

## 2024-08-30 NOTE — ED Notes (Signed)

## 2024-08-30 NOTE — Discharge Instructions (Addendum)
 Your symptoms are consistent with most likely an influenza-like illness.  Tamiflu was offered but was declined.  Tessalon has been prescribed.  EKG, remaining laboratory evaluation was overall reassuring.  Given your elevated D-dimer, a CT angiogram of your chest study was performed and showed no evidence of pneumonia, pneumothorax or pulmonary embolism.  Recommend supportive care with Tylenol , ibuprofen, continued oral rehydration outpatient.  With your primary care provider to ensure resolution.  You had normal cardiac enzymes which points away from a heart attack.

## 2024-08-30 NOTE — ED Provider Notes (Addendum)
 " [place] EMERGENCY DEPARTMENT AT [facility] Provider Note   CSN: [id number] Arrival date & time: 08/29/24  2213     Patient presents with: Chest Pain   Alejandra Figueroa is a 38 y.o. female.    Chest Pain    38 year old female with medical history significant for anxiety, angiosarcoma of the left breast presents to the emergency department with chest tightness, nausea.  She has had a productive cough over the past couple of days, no known sick contacts.  She feels like she has had some chest congestion as well.  No fevers or chills.  No lower extremity swelling.  Prior to Admission medications  Medication Sig Start Date End Date Taking? Authorizing Provider  benzonatate (TESSALON) 100 MG capsule Take 1 capsule (100 mg total) by mouth every 8 (eight) hours. 08/30/24  Yes Jerrol Agent, MD  cyclobenzaprine  (FLEXERIL ) 5 MG tablet Take 1 tablet (5 mg total) by mouth 2 (two) times daily. 06/02/24   Gray, Sarah E, NP  hydrOXYzine (ATARAX) 25 MG tablet Take 25 mg by mouth 4 (four) times daily as needed. 01/21/22   [provider]  LORazepam  (ATIVAN ) 0.5 MG tablet Take 1 tablet (0.5 mg total) by mouth 2 (two) times daily as needed for anxiety. 08/04/24   Rollene Almarie LABOR, MD  medroxyPROGESTERone  (PROVERA ) 10 MG tablet Take 1 tablet by mouth. For 12 days until the next month 04/10/20   [provider]  meloxicam  (MOBIC ) 15 MG tablet TAKE 1 TABLET (15 MG TOTAL) BY MOUTH DAILY WITH FOOD AS NEEDED FOR PAIN 05/24/24   Rollene Almarie LABOR, MD  ondansetron  (ZOFRAN -ODT) 4 MG disintegrating tablet Take 1 tablet (4 mg total) by mouth every 8 (eight) hours as needed for nausea or vomiting. 07/24/24   Rollene Almarie LABOR, MD  pantoprazole  (PROTONIX ) 40 MG tablet TAKE 1 TABLET BY MOUTH EVERY DAY 08/16/24   Rollene Almarie LABOR, MD    Allergies: Other, Morphine and codeine, Nystatin , and Peanut-containing drug products    Review of Systems  Cardiovascular:  Positive  for chest pain.  All other systems reviewed and are negative.   Updated Vital Signs BP 121/76 (BP Location: Right Arm)   Pulse 90   Temp 98 F (36.7 C) (Oral)   Resp 18   SpO2 99%   Physical Exam Vitals and nursing note reviewed.  Constitutional:      General: She is not in acute distress.    Appearance: She is well-developed.  HENT:     Head: Normocephalic and atraumatic.  Eyes:     Conjunctiva/sclera: Conjunctivae normal.  Cardiovascular:     Rate and Rhythm: Regular rhythm. Tachycardia present.     Heart sounds: No murmur heard. Pulmonary:     Effort: Pulmonary effort is normal. No respiratory distress.     Breath sounds: Normal breath sounds.  Abdominal:     Palpations: Abdomen is soft.     Tenderness: There is no abdominal tenderness.  Musculoskeletal:        General: No swelling.     Cervical back: Neck supple.  Skin:    General: Skin is warm and dry.     Capillary Refill: Capillary refill takes less than 2 seconds.  Neurological:     Mental Status: She is alert.  Psychiatric:        Mood and Affect: Mood normal.     (all labs ordered are listed, but only abnormal results are displayed) Labs Reviewed  BASIC METABOLIC PANEL WITH GFR -  Abnormal; Notable for the following components:      Result Value   Potassium 3.4 (*)    CO2 21 (*)    All other components within normal limits  CBC - Abnormal; Notable for the following components:   RBC 5.49 (*)    MCV 78.0 (*)    All other components within normal limits  D-DIMER, QUANTITATIVE - Abnormal; Notable for the following components:   D-Dimer, Quant 0.75 (*)    All other components within normal limits  PREGNANCY, URINE  TROPONIN T, HIGH SENSITIVITY  TROPONIN T, HIGH SENSITIVITY    EKG: EKG Interpretation Date/Time:  Tuesday August 29 2024 22:34:57 EST Ventricular Rate:  111 PR Interval:  175 QRS Duration:  98 QT Interval:  334 QTC Calculation: 454 R Axis:   90  Text Interpretation: Sinus  tachycardia Borderline right axis deviation Abnormal R-wave progression, late transition Borderline T wave abnormalities Confirmed by Jerrol Agent ([id number]) on 08/30/2024 4:31:07 AM  Radiology: CT Angio Chest PE W and/or Wo Contrast Result Date: 08/30/2024 EXAM: CTA CHEST 08/30/2024 06:56:47 AM TECHNIQUE: CTA of the chest was performed without and with the administration of 100 mL of iohexol (OMNIPAQUE) 350 MG/ML injection. Multiplanar reformatted images are provided for review. MIP images are provided for review. Automated exposure control, iterative reconstruction, and/or weight based adjustment of the mA/kV was utilized to reduce the radiation dose to as low as reasonably achievable. COMPARISON: CT pulmonary angiogram dated 04/02/2018. CLINICAL HISTORY: Pulmonary embolism (PE) suspected, low to intermediate prob, neg D-dimer. FINDINGS: PULMONARY ARTERIES: Pulmonary arteries are adequately opacified for evaluation. No acute pulmonary embolus. Main pulmonary artery is normal in caliber. MEDIASTINUM: The heart and pericardium demonstrate no acute abnormality. There is no acute abnormality of the thoracic aorta. LYMPH NODES: No mediastinal, hilar or axillary lymphadenopathy. LUNGS AND PLEURA: The lungs are without acute process. No focal consolidation or pulmonary edema. No evidence of pleural effusion or pneumothorax. UPPER ABDOMEN: Limited images of the upper abdomen are unremarkable. SOFT TISSUES AND BONES: A left breast implant is again demonstrated. No acute bone or soft tissue abnormality. IMPRESSION: 1. No evidence of pulmonary embolism. Electronically signed by: Evalene Coho MD 08/30/2024 07:04 AM EST RP Workstation: [id number]   DG Chest 2 View Result Date: 08/29/2024 CLINICAL DATA:  Chest pain. EXAM: CHEST - 2 VIEW COMPARISON:  11/21/2009, chest CT 04/02/2018 FINDINGS: The cardiomediastinal contours are normal. The lungs are clear. Pulmonary vasculature is normal. No consolidation, pleural  effusion, or pneumothorax. No acute osseous abnormalities are seen. IMPRESSION: No active cardiopulmonary disease. Electronically Signed   By: Andrea Gasman M.D.   On: 08/29/2024 23:06     Procedures   Medications Ordered in the ED  LORazepam  (ATIVAN ) injection 1 mg (1 mg Intravenous Given 08/30/24 0609)  iohexol (OMNIPAQUE) 350 MG/ML injection 100 mL (100 mLs Intravenous Contrast Given 08/30/24 0656)                                    Medical Decision Making Amount and/or Complexity of Data Reviewed Labs: ordered. Radiology: ordered.  Risk Prescription drug management.     38 year old female with medical history significant for anxiety, angiosarcoma of the left breast presents to the emergency department with chest tightness, nausea.  She has had a productive cough over the past couple of days, no known sick contacts.  She feels like she has had some chest congestion as well.  No fevers  or chills.  No lower extremity swelling.  On arrival, the patient was afebrile, tachycardic heart rate 104, not tachypneic, BP 155/96, saturating her percent on room air.  Differential diagnosis includes influenza/influenza-like illness, considered pneumothorax, pneumonia, pulmonary embolism, less likely ACS, considered anxiety.  Informed the patient that we are not currently performing diagnostic testing for influenza given lack of test swabs in the community, only testing patients being admitted or with significant medical comorbidities.  Patient is PERC positive.  Workup Initiated to include EKG, chest x-ray, screening laboratory evaluation.  EKG: Sinus tachycardia, ventricular rate 111, nonspecific T wave changes noted  Chest x-ray: No acute cardiopulmonary disease, no pneumothorax or pneumonia  Labs: Cardiac troponin less than 15, urine pregnancy negative, CBC without a leukocytosis or anemia, BMP with only mild hypokalemia to 3.4, otherwise unremarkable.  Delta cardiac troponin pending.  Dimer elevated at 0.75.  CTA PE; IMPRESSION:  1. No evidence of pulmonary embolism.   Chest discomfort has been ongoing for the past few days, low concern for ACS with single normal cardiac troponin.  Repeat collected and per lab was also normal.  The risks and benefits of Tamiflu in the setting of influenza-like illness.   Patient declined Tamiflu at this time.  Tessalon prescribed, patient advised continued symptomatic management with Tylenol  and ibuprofen as needed, continue oral rehydration.  Stable for discharge.      Final diagnoses:  Influenza-like illness    ED Discharge Orders          Ordered    benzonatate (TESSALON) 100 MG capsule  Every 8 hours        08/30/24 0717                   Jerrol Agent, MD 08/30/24 0720  "

## 2024-08-30 NOTE — ED Notes (Signed)
 Pt returned from CT
